# Patient Record
Sex: Male | Born: 1935 | Race: White | Hispanic: No | Marital: Married | Smoking: Never smoker
Health system: Southern US, Community
[De-identification: ages and names within clinical notes are randomized; demographics above are authoritative.]

## PROBLEM LIST (undated history)

## (undated) DIAGNOSIS — M1990 Unspecified osteoarthritis, unspecified site: Secondary | ICD-10-CM

## (undated) DIAGNOSIS — R112 Nausea with vomiting, unspecified: Secondary | ICD-10-CM

## (undated) DIAGNOSIS — S56419A Strain of extensor muscle, fascia and tendon of finger, unspecified finger at forearm level, initial encounter: Secondary | ICD-10-CM

## (undated) DIAGNOSIS — Z9889 Other specified postprocedural states: Secondary | ICD-10-CM

## (undated) DIAGNOSIS — S52501A Unspecified fracture of the lower end of right radius, initial encounter for closed fracture: Secondary | ICD-10-CM

## (undated) HISTORY — PX: PROSTATE SURGERY: SHX751

## (undated) HISTORY — PX: FOOT SURGERY: SHX648

## (undated) HISTORY — PX: CERVICAL LAMINECTOMY: SHX94

## (undated) HISTORY — PX: SHOULDER ARTHROSCOPY: SHX128

## (undated) HISTORY — PX: EYE SURGERY: SHX253

## (undated) HISTORY — PX: HERNIA REPAIR: SHX51

## (undated) HISTORY — PX: BACK SURGERY: SHX140

## (undated) HISTORY — PX: APPENDECTOMY: SHX54

---

## 2000-10-16 DIAGNOSIS — N209 Urinary calculus, unspecified: Secondary | ICD-10-CM | POA: Insufficient documentation

## 2000-10-16 DIAGNOSIS — N529 Male erectile dysfunction, unspecified: Secondary | ICD-10-CM | POA: Insufficient documentation

## 2002-08-28 DIAGNOSIS — E782 Mixed hyperlipidemia: Secondary | ICD-10-CM | POA: Insufficient documentation

## 2004-06-11 DIAGNOSIS — K279 Peptic ulcer, site unspecified, unspecified as acute or chronic, without hemorrhage or perforation: Secondary | ICD-10-CM | POA: Insufficient documentation

## 2007-07-26 DIAGNOSIS — M549 Dorsalgia, unspecified: Secondary | ICD-10-CM | POA: Insufficient documentation

## 2016-09-23 ENCOUNTER — Other Ambulatory Visit: Payer: Self-pay | Admitting: Orthopedic Surgery

## 2016-09-23 ENCOUNTER — Encounter (HOSPITAL_BASED_OUTPATIENT_CLINIC_OR_DEPARTMENT_OTHER): Payer: Self-pay | Admitting: *Deleted

## 2016-09-23 DIAGNOSIS — S52591A Other fractures of lower end of right radius, initial encounter for closed fracture: Secondary | ICD-10-CM | POA: Insufficient documentation

## 2016-09-23 DIAGNOSIS — M20011 Mallet finger of right finger(s): Secondary | ICD-10-CM | POA: Insufficient documentation

## 2016-09-29 ENCOUNTER — Ambulatory Visit (HOSPITAL_BASED_OUTPATIENT_CLINIC_OR_DEPARTMENT_OTHER): Payer: Medicare Other | Admitting: Anesthesiology

## 2016-09-29 ENCOUNTER — Ambulatory Visit (HOSPITAL_BASED_OUTPATIENT_CLINIC_OR_DEPARTMENT_OTHER)
Admission: RE | Admit: 2016-09-29 | Discharge: 2016-09-29 | Disposition: A | Discharge status: Home / Self Care | Payer: Medicare Other | Source: Ambulatory Visit | Attending: Orthopedic Surgery | Admitting: Orthopedic Surgery

## 2016-09-29 ENCOUNTER — Encounter (HOSPITAL_BASED_OUTPATIENT_CLINIC_OR_DEPARTMENT_OTHER)
Admission: RE | Disposition: A | Discharge status: Home / Self Care | Payer: Self-pay | Source: Ambulatory Visit | Attending: Orthopedic Surgery

## 2016-09-29 ENCOUNTER — Encounter (HOSPITAL_BASED_OUTPATIENT_CLINIC_OR_DEPARTMENT_OTHER): Payer: Self-pay | Admitting: *Deleted

## 2016-09-29 DIAGNOSIS — S52571A Other intraarticular fracture of lower end of right radius, initial encounter for closed fracture: Secondary | ICD-10-CM | POA: Diagnosis present

## 2016-09-29 DIAGNOSIS — Y92410 Unspecified street and highway as the place of occurrence of the external cause: Secondary | ICD-10-CM | POA: Insufficient documentation

## 2016-09-29 DIAGNOSIS — M20011 Mallet finger of right finger(s): Secondary | ICD-10-CM | POA: Diagnosis not present

## 2016-09-29 DIAGNOSIS — Z7982 Long term (current) use of aspirin: Secondary | ICD-10-CM | POA: Diagnosis not present

## 2016-09-29 HISTORY — DX: Unspecified fracture of the lower end of right radius, initial encounter for closed fracture: S52.501A

## 2016-09-29 HISTORY — DX: Unspecified osteoarthritis, unspecified site: M19.90

## 2016-09-29 HISTORY — PX: OPEN REDUCTION INTERNAL FIXATION (ORIF) DISTAL RADIAL FRACTURE: SHX5989

## 2016-09-29 SURGERY — OPEN REDUCTION INTERNAL FIXATION (ORIF) DISTAL RADIUS FRACTURE
Anesthesia: General | Site: Wrist | Laterality: Right

## 2016-09-29 MED ORDER — PROPOFOL 10 MG/ML IV BOLUS
INTRAVENOUS | Status: AC
  Filled 2016-09-29: qty 20

## 2016-09-29 MED ORDER — EPHEDRINE SULFATE 50 MG/ML IJ SOLN
INTRAMUSCULAR | Status: DC | PRN
  Administered 2016-09-29: 25 mg via INTRAVENOUS

## 2016-09-29 MED ORDER — FENTANYL CITRATE (PF) 100 MCG/2ML IJ SOLN
INTRAMUSCULAR | Status: AC
  Filled 2016-09-29: qty 2

## 2016-09-29 MED ORDER — ONDANSETRON HCL 4 MG/2ML IJ SOLN
INTRAMUSCULAR | Status: AC
  Filled 2016-09-29: qty 2

## 2016-09-29 MED ORDER — MIDAZOLAM HCL 2 MG/2ML IJ SOLN: 1.0000 mg | INTRAMUSCULAR | Status: DC | PRN

## 2016-09-29 MED ORDER — EPHEDRINE 5 MG/ML INJ
INTRAVENOUS | Status: AC
  Filled 2016-09-29: qty 10

## 2016-09-29 MED ORDER — BUPIVACAINE-EPINEPHRINE (PF) 0.5% -1:200000 IJ SOLN
INTRAMUSCULAR | Status: DC | PRN
  Administered 2016-09-29: 20 mL via PERINEURAL
  Administered 2016-09-29: 10 mL via PERINEURAL

## 2016-09-29 MED ORDER — ONDANSETRON HCL 4 MG/2ML IJ SOLN: 4.0000 mg | Freq: Once | INTRAMUSCULAR | Status: DC | PRN

## 2016-09-29 MED ORDER — FENTANYL CITRATE (PF) 100 MCG/2ML IJ SOLN
25.0000 ug | INTRAMUSCULAR | Status: DC | PRN
  Administered 2016-09-29: 50 ug via INTRAVENOUS
  Administered 2016-09-29: 25 ug via INTRAVENOUS

## 2016-09-29 MED ORDER — KETOROLAC TROMETHAMINE 30 MG/ML IJ SOLN
INTRAMUSCULAR | Status: AC
  Filled 2016-09-29: qty 1

## 2016-09-29 MED ORDER — ONDANSETRON HCL 4 MG/2ML IJ SOLN
INTRAMUSCULAR | Status: DC | PRN
  Administered 2016-09-29: 4 mg via INTRAVENOUS

## 2016-09-29 MED ORDER — SCOPOLAMINE 1 MG/3DAYS TD PT72: 1.0000 | MEDICATED_PATCH | Freq: Once | TRANSDERMAL | Status: DC | PRN

## 2016-09-29 MED ORDER — OXYCODONE-ACETAMINOPHEN 7.5-325 MG PO TABS: 1.0000 | ORAL_TABLET | ORAL | 0 refills | Status: DC | PRN

## 2016-09-29 MED ORDER — CHLORHEXIDINE GLUCONATE 4 % EX LIQD: 60.0000 mL | Freq: Once | CUTANEOUS | Status: DC

## 2016-09-29 MED ORDER — ACETAMINOPHEN 10 MG/ML IV SOLN
1000.0000 mg | Freq: Once | INTRAVENOUS | Status: AC
  Administered 2016-09-29: 1000 mg via INTRAVENOUS

## 2016-09-29 MED ORDER — ACETAMINOPHEN 10 MG/ML IV SOLN
INTRAVENOUS | Status: AC
  Filled 2016-09-29: qty 100

## 2016-09-29 MED ORDER — LACTATED RINGERS IV SOLN
INTRAVENOUS | Status: DC
  Administered 2016-09-29 (×2): via INTRAVENOUS

## 2016-09-29 MED ORDER — HYDROMORPHONE HCL 1 MG/ML IJ SOLN
INTRAMUSCULAR | Status: AC
  Filled 2016-09-29: qty 0.5

## 2016-09-29 MED ORDER — PREGABALIN 100 MG PO CAPS
100.0000 mg | ORAL_CAPSULE | Freq: Once | ORAL | Status: AC
  Administered 2016-09-29: 100 mg via ORAL
  Filled 2016-09-29: qty 1

## 2016-09-29 MED ORDER — OXYCODONE HCL 5 MG PO TABS
5.0000 mg | ORAL_TABLET | Freq: Once | ORAL | Status: AC
  Administered 2016-09-29: 5 mg via ORAL

## 2016-09-29 MED ORDER — MIDAZOLAM HCL 2 MG/2ML IJ SOLN
INTRAMUSCULAR | Status: AC
  Filled 2016-09-29: qty 2

## 2016-09-29 MED ORDER — PROPOFOL 10 MG/ML IV BOLUS
INTRAVENOUS | Status: DC | PRN
  Administered 2016-09-29: 150 mg via INTRAVENOUS
  Administered 2016-09-29: 50 mg via INTRAVENOUS

## 2016-09-29 MED ORDER — HYDROCODONE-ACETAMINOPHEN 5-325 MG PO TABS: 1.0000 | ORAL_TABLET | Freq: Four times a day (QID) | ORAL | 0 refills | Status: DC | PRN

## 2016-09-29 MED ORDER — DEXAMETHASONE SODIUM PHOSPHATE 4 MG/ML IJ SOLN
INTRAMUSCULAR | Status: DC | PRN
  Administered 2016-09-29: 10 mg via INTRAVENOUS

## 2016-09-29 MED ORDER — CEFAZOLIN SODIUM-DEXTROSE 2-4 GM/100ML-% IV SOLN
2.0000 g | INTRAVENOUS | Status: AC
  Administered 2016-09-29: 2 g via INTRAVENOUS

## 2016-09-29 MED ORDER — CEFAZOLIN SODIUM-DEXTROSE 2-4 GM/100ML-% IV SOLN
INTRAVENOUS | Status: AC
  Filled 2016-09-29: qty 100

## 2016-09-29 MED ORDER — ACETAMINOPHEN 500 MG PO TABS: 1000.0000 mg | ORAL_TABLET | Freq: Once | ORAL | Status: DC

## 2016-09-29 MED ORDER — LIDOCAINE HCL (CARDIAC) 20 MG/ML IV SOLN
INTRAVENOUS | Status: DC | PRN
  Administered 2016-09-29: 50 mg via INTRAVENOUS

## 2016-09-29 MED ORDER — FENTANYL CITRATE (PF) 100 MCG/2ML IJ SOLN
50.0000 ug | INTRAMUSCULAR | Status: DC | PRN
  Administered 2016-09-29: 50 ug via INTRAVENOUS

## 2016-09-29 MED ORDER — DEXAMETHASONE SODIUM PHOSPHATE 10 MG/ML IJ SOLN
INTRAMUSCULAR | Status: AC
  Filled 2016-09-29: qty 1

## 2016-09-29 MED ORDER — HYDROMORPHONE HCL 1 MG/ML IJ SOLN
0.5000 mg | Freq: Once | INTRAMUSCULAR | Status: AC
  Administered 2016-09-29: 0.5 mg via INTRAVENOUS

## 2016-09-29 MED ORDER — HYDROMORPHONE HCL 1 MG/ML IJ SOLN
0.5000 mg | Freq: Once | INTRAMUSCULAR | Status: AC
Start: 2016-09-29 — End: 2016-09-29
  Administered 2016-09-29: 0.5 mg via INTRAVENOUS

## 2016-09-29 MED ORDER — FENTANYL CITRATE (PF) 100 MCG/2ML IJ SOLN
INTRAMUSCULAR | Status: DC | PRN
  Administered 2016-09-29 (×8): 25 ug via INTRAVENOUS

## 2016-09-29 MED ORDER — PREGABALIN 50 MG PO CAPS: 50.0000 mg | ORAL_CAPSULE | Freq: Two times a day (BID) | ORAL | 0 refills | Status: DC

## 2016-09-29 MED ORDER — OXYCODONE HCL 5 MG PO TABS
ORAL_TABLET | ORAL | Status: AC
  Filled 2016-09-29: qty 1

## 2016-09-29 MED ORDER — LIDOCAINE 2% (20 MG/ML) 5 ML SYRINGE
INTRAMUSCULAR | Status: AC
  Filled 2016-09-29: qty 5

## 2016-09-29 MED ORDER — KETOROLAC TROMETHAMINE 30 MG/ML IJ SOLN
30.0000 mg | Freq: Once | INTRAMUSCULAR | Status: AC
  Administered 2016-09-29: 30 mg via INTRAVENOUS

## 2016-09-29 SURGICAL SUPPLY — 57 items
BIT DRILL 2.0 LNG QUCK RELEASE (BIT) ×1 IMPLANT
BIT DRILL 2.8 QUICK RELEASE (BIT) ×1 IMPLANT
BLADE MINI RND TIP GREEN BEAV (BLADE) ×3 IMPLANT
BLADE SURG 15 STRL LF DISP TIS (BLADE) ×1 IMPLANT
BLADE SURG 15 STRL SS (BLADE) ×2
BNDG COHESIVE 3X5 TAN STRL LF (GAUZE/BANDAGES/DRESSINGS) ×3 IMPLANT
BNDG ESMARK 4X9 LF (GAUZE/BANDAGES/DRESSINGS) ×3 IMPLANT
BNDG GAUZE ELAST 4 BULKY (GAUZE/BANDAGES/DRESSINGS) ×3 IMPLANT
BONE CHIP PRESERV 5CC PCAN5 (Bone Implant) ×3 IMPLANT
CHLORAPREP W/TINT 26ML (MISCELLANEOUS) ×3 IMPLANT
CORD BIPOLAR FORCEPS 12FT (ELECTRODE) ×3 IMPLANT
COVER BACK TABLE 60X90IN (DRAPES) ×3 IMPLANT
COVER MAYO STAND STRL (DRAPES) ×3 IMPLANT
CUFF TOURNIQUET SINGLE 18IN (TOURNIQUET CUFF) ×3 IMPLANT
DECANTER SPIKE VIAL GLASS SM (MISCELLANEOUS) IMPLANT
DRAPE EXTREMITY T 121X128X90 (DRAPE) ×3 IMPLANT
DRAPE OEC MINIVIEW 54X84 (DRAPES) ×3 IMPLANT
DRAPE SURG 17X23 STRL (DRAPES) ×3 IMPLANT
DRILL 2.0 LNG QUICK RELEASE (BIT) ×3
DRILL 2.8 QUICK RELEASE (BIT) ×3
GAUZE SPONGE 4X4 12PLY STRL (GAUZE/BANDAGES/DRESSINGS) ×3 IMPLANT
GAUZE XEROFORM 1X8 LF (GAUZE/BANDAGES/DRESSINGS) ×3 IMPLANT
GLOVE BIO SURGEON STRL SZ7 (GLOVE) ×3 IMPLANT
GLOVE BIOGEL PI IND STRL 7.0 (GLOVE) ×1 IMPLANT
GLOVE BIOGEL PI IND STRL 8.5 (GLOVE) ×1 IMPLANT
GLOVE BIOGEL PI INDICATOR 7.0 (GLOVE) ×2
GLOVE BIOGEL PI INDICATOR 8.5 (GLOVE) ×2
GLOVE SURG ORTHO 8.0 STRL STRW (GLOVE) ×3 IMPLANT
GOWN STRL REUS W/ TWL LRG LVL3 (GOWN DISPOSABLE) ×1 IMPLANT
GOWN STRL REUS W/ TWL XL LVL3 (GOWN DISPOSABLE) ×1 IMPLANT
GOWN STRL REUS W/TWL LRG LVL3 (GOWN DISPOSABLE) ×2
GOWN STRL REUS W/TWL XL LVL3 (GOWN DISPOSABLE) ×5 IMPLANT
GUIDEWIRE ORTHO 0.054X6 (WIRE) ×9 IMPLANT
NEEDLE PRECISIONGLIDE 27X1.5 (NEEDLE) IMPLANT
NS IRRIG 1000ML POUR BTL (IV SOLUTION) ×3 IMPLANT
PACK BASIN DAY SURGERY FS (CUSTOM PROCEDURE TRAY) ×3 IMPLANT
PAD CAST 3X4 CTTN HI CHSV (CAST SUPPLIES) ×1 IMPLANT
PADDING CAST COTTON 3X4 STRL (CAST SUPPLIES) ×2
PLATE PROXIMAL ACULOC 2 WD RT (Plate) ×3 IMPLANT
SCREW CORT FT 22X2.3XLCK HEX (Screw) ×2 IMPLANT
SCREW CORTICAL LOCKING 2.3X22M (Screw) ×10 IMPLANT
SCREW CORTICAL LOCKING 2.3X24M (Screw) ×4 IMPLANT
SCREW FX22X2.3XLCK SMTH NS CRT (Screw) ×3 IMPLANT
SCREW FX24X2.3XLCK SMTH NS CRT (Screw) ×2 IMPLANT
SCREW HEX 3.5X15 NLCKG STRL (Screw) ×1 IMPLANT
SCREW HEX 3.5X15MM (Screw) ×3 IMPLANT
SCREW HEXALOBE NON-LOCK 3.5X14 (Screw) ×9 IMPLANT
SLEEVE SCD COMPRESS KNEE MED (MISCELLANEOUS) ×3 IMPLANT
SPLINT PLASTER CAST XFAST 3X15 (CAST SUPPLIES) IMPLANT
SPLINT PLASTER XTRA FASTSET 3X (CAST SUPPLIES)
STOCKINETTE 4X48 STRL (DRAPES) ×3 IMPLANT
SUT ETHILON 4 0 PS 2 18 (SUTURE) ×3 IMPLANT
SUT VICRYL 4-0 PS2 18IN ABS (SUTURE) ×3 IMPLANT
SYR BULB 3OZ (MISCELLANEOUS) ×3 IMPLANT
SYR CONTROL 10ML LL (SYRINGE) IMPLANT
TOWEL OR 17X24 6PK STRL BLUE (TOWEL DISPOSABLE) ×3 IMPLANT
UNDERPAD 30X30 (UNDERPADS AND DIAPERS) ×3 IMPLANT

## 2016-09-29 NOTE — Op Note (Signed)
I assisted Surgeon(s) and Role:    * Daryll Brod, MD - Primary    Leanora Cover, MD - Assisting on the Procedure(s): OPEN REDUCTION INTERNAL FIXATION (ORIF) RIGHT DISTAL RADIAL FRACTURE on 09/29/2016.  I provided assistance on this case as follows: retraction soft tissues, reduction fracture, placement hardware.  Electronically signed by: Tennis Must, MD Date: 09/29/2016 Time: 4:09 PM

## 2016-09-29 NOTE — Anesthesia Procedure Notes (Signed)
Procedure Name: LMA Insertion Date/Time: 09/29/2016 2:49 PM Performed by: Toula Moos L Pre-anesthesia Checklist: Patient identified, Emergency Drugs available, Suction available, Patient being monitored and Timeout performed Patient Re-evaluated:Patient Re-evaluated prior to induction Oxygen Delivery Method: Circle system utilized Preoxygenation: Pre-oxygenation with 100% oxygen Induction Type: IV induction Ventilation: Mask ventilation without difficulty LMA: LMA inserted LMA Size: 5.0 Number of attempts: 1 Airway Equipment and Method: Bite block Placement Confirmation: positive ETCO2 Tube secured with: Tape Dental Injury: Teeth and Oropharynx as per pre-operative assessment

## 2016-09-29 NOTE — Brief Op Note (Signed)
09/29/2016  4:08 PM  PATIENT:  Rodney Hill.  82 y.o. male  PRE-OPERATIVE DIAGNOSIS:  RIGHT DISTAL RADIUS FRACTURE  POST-OPERATIVE DIAGNOSIS:  RIGHT DISTAL RADIUS FRACTURE  PROCEDURE:  Procedure(s): OPEN REDUCTION INTERNAL FIXATION (ORIF) RIGHT DISTAL RADIAL FRACTURE (Right)  SURGEON:  Surgeon(s) and Role:    * Daryll Brod, MD - Primary    * Leanora Cover, MD - Assisting  PHYSICIAN ASSISTANT:   ASSISTANTS: K Gaddiel Cullens,MD   ANESTHESIA:   regional and general  EBL:  Total I/O In: 1600 [I.V.:1600] Out: 2 [Blood:2]  BLOOD ADMINISTERED:none  DRAINS: none   LOCAL MEDICATIONS USED:  NONE  SPECIMEN:  No Specimen  DISPOSITION OF SPECIMEN:  N/A  COUNTS:  YES  TOURNIQUET:   Total Tourniquet Time Documented: Upper Arm (laterality) - 60 minutes Total: Upper Arm (laterality) - 60 minutes   DICTATION: .Other Dictation: Dictation Number [id number]  PLAN OF CARE: Discharge to home after PACU  PATIENT DISPOSITION:  PACU - hemodynamically stable.

## 2016-09-29 NOTE — Progress Notes (Signed)
Assisted Dr. Turk with right, ultrasound guided, supraclavicular block. Side rails up, monitors on throughout procedure. See vital signs in flow sheet. Tolerated Procedure well. 

## 2016-09-29 NOTE — Discharge Instructions (Signed)

## 2016-09-29 NOTE — Anesthesia Procedure Notes (Signed)
Anesthesia Regional Block: Supraclavicular block   Pre-Anesthetic Checklist: ,, timeout performed, Correct Patient, Correct Site, Correct Laterality, Correct Procedure, Correct Position, site marked, Risks and benefits discussed,  Surgical consent,  Pre-op evaluation,  At surgeon's request and post-op pain management  Laterality: Right  Prep: chloraprep       Needles:  Injection technique: Single-shot  Needle Type: Echogenic Needle     Needle Length: 9cm  Needle Gauge: 21     Additional Needles:   Procedures: ultrasound guided,,,,,,,,  Narrative:  Start time: 09/29/2016 1:51 PM End time: 09/29/2016 1:56 PM Injection made incrementally with aspirations every 5 mL.  Performed by: Personally  Anesthesiologist: Catalina Gravel  Additional Notes: No pain on injection. No increased resistance to injection. Injection made in 5cc increments.  Good needle visualization.  Patient tolerated procedure well.

## 2016-09-29 NOTE — Progress Notes (Signed)
Shortly after arrival to PACU at 1615, patient awoke to extreme pain from his wrist going down through his fingers.  Capillary refill is present, color is appropriate, able to purposefully move fingers and arm, bandage does not appear tight per RN, fingers have swelling but remain soft. Daryll Brod, MD assessed along with Hoy Morn, Anesthesiologist.   [phone], follow up call by RN to Daryll Brod, MD.  MD aware of patient condition, pain is beginning to lessen, however still very uncomfortable.  RN will continue to monitor appropriately.

## 2016-09-29 NOTE — Op Note (Signed)
:   Dictation Number [id number]

## 2016-09-29 NOTE — H&P (Signed)
Rodney Hill. is an 82 y.o. male.   Chief Complaint:fracture right wrist HPI: Rodney Hill is a 82 year old right-hand-dominant male referred by Dr. Vanita Panda for consultation regarding a distal radius fracture right arm. He sustained a fall approximately 09/14/2016 he had a fracture of his heel was placed in a boot which resulted in his inability to meet remain upright. He is in addition sustained a mallet injury to his right ring finger. He has no prior history of injury. He has had a carpal tunnel release done in the past. He has been placed in a splint and referred. He complains of constant throbbing aching pain with a VAS score of 6-7/10. He has no history of diabetes thyroid problems or gout. He has a history of arthritis. History is positive arthritis negative for diabetes thyroid problems and gout. Is given a prescription for nitro hydrocodone which he has not taken. He has been taking Tylenol for pain. Patient seems to make his discomfort better. I hang it down makes it worse.      Past Medical History:  Diagnosis Date  . Arthritis   . Distal radius fracture, right     Past Surgical History:  Procedure Laterality Date  . APPENDECTOMY    . BACK SURGERY    . CERVICAL LAMINECTOMY    . EYE SURGERY Bilateral    cataract  . FOOT SURGERY Right    x2  . HERNIA REPAIR     UHR  . PROSTATE SURGERY    . SHOULDER ARTHROSCOPY Bilateral     History reviewed. No pertinent family history. Social History:  reports that he has never smoked. He has never used smokeless tobacco. He reports that he does not drink alcohol or use drugs.  Allergies: No Known Allergies  Medications Prior to Admission  Medication Sig Dispense Refill  . aspirin EC 81 MG tablet Take 81 mg by mouth daily.    Marland Kitchen HYDROcodone-acetaminophen (NORCO/VICODIN) 5-325 MG tablet Take 1 tablet by mouth every 6 (six) hours as needed for moderate pain.    . traMADol (ULTRAM) 50 MG tablet Take by mouth every 6 (six) hours as needed.       No results found for this or any previous visit (from the past 48 hour(s)).  No results found.   Pertinent items are noted in HPI.  Height 6\' 1"  (1.854 m), weight 96.2 kg (212 lb).  General appearance: alert, cooperative and appears stated age Head: Normocephalic, without obvious abnormality Neck: no JVD Resp: clear to auscultation bilaterally Cardio: regular rate and rhythm, S1, S2 normal, no murmur, click, rub or gallop GI: soft, non-tender; bowel sounds normal; no masses,  no organomegaly Extremities: fracture right wrist Pulses: 2+ and symmetric Skin: Skin color, texture, turgor normal. No rashes or lesions Neurologic: Grossly normal Incision/Wound: na  Assessment/Plan Assessment:  1. Other fractures of lower end of right radius 2. Acquired mallet finger of right hand    Plan: Discussed open reduction internal fixation bone grafting with him. He is aware there is no guarantee to the surgery the possibility of infection recurrence injury to arteries nerves tendons incomplete relief symptoms dystrophy. Possibility of having to remove plate later. Use of bone graft. He Would like to proceed. we will place him in a Hexyl light splint on his ring finger postoperatively. Questions are encouraged and answered to his satisfaction.      Kilani Joffe R 09/29/2016, 12:58 PM

## 2016-09-29 NOTE — Anesthesia Preprocedure Evaluation (Signed)
Anesthesia Evaluation  Patient identified by MRN, date of birth, ID band Patient awake    Reviewed: Allergy & Precautions, NPO status , Patient's Chart, lab work & pertinent test results  Airway Mallampati: II  TM Distance: >3 FB Neck ROM: Full    Dental  (+) Teeth Intact, Dental Advisory Given   Pulmonary neg pulmonary ROS,    Pulmonary exam normal breath sounds clear to auscultation       Cardiovascular Exercise Tolerance: Good negative cardio ROS Normal cardiovascular exam Rhythm:Regular Rate:Normal     Neuro/Psych S/p cervical laminectomy  negative neurological ROS     GI/Hepatic negative GI ROS, Neg liver ROS,   Endo/Other  negative endocrine ROS  Renal/GU negative Renal ROS     Musculoskeletal  (+) Arthritis , Osteoarthritis,  Right distal radius fracture    Abdominal   Peds  Hematology negative hematology ROS (+)   Anesthesia Other Findings Day of surgery medications reviewed with the patient.  Reproductive/Obstetrics                             Anesthesia Physical Anesthesia Plan  ASA: II  Anesthesia Plan: General   Post-op Pain Management:  Regional for Post-op pain   Induction: Intravenous  PONV Risk Score and Plan: 2 and Ondansetron and Dexamethasone  Airway Management Planned: LMA  Additional Equipment:   Intra-op Plan:   Post-operative Plan: Extubation in OR  Informed Consent: I have reviewed the patients History and Physical, chart, labs and discussed the procedure including the risks, benefits and alternatives for the proposed anesthesia with the patient or authorized representative who has indicated his/her understanding and acceptance.   Dental advisory given  Plan Discussed with: CRNA  Anesthesia Plan Comments: (Risks/benefits of general anesthesia discussed with patient including risk of damage to teeth, lips, gum, and tongue, nausea/vomiting,  allergic reactions to medications, and the possibility of heart attack, stroke and death.  All patient questions answered.  Patient wishes to proceed.)        Anesthesia Quick Evaluation

## 2016-09-29 NOTE — Transfer of Care (Signed)
Immediate Anesthesia Transfer of Care Note  Patient: Rodney Hill.  Procedure(s) Performed: Procedure(s): OPEN REDUCTION INTERNAL FIXATION (ORIF) RIGHT DISTAL RADIAL FRACTURE (Right)  Patient Location: PACU  Anesthesia Type:GA combined with regional for post-op pain  Level of Consciousness: sedated  Airway & Oxygen Therapy: Patient Spontanous Breathing and Patient connected to face mask oxygen  Post-op Assessment: Report given to RN and Post -op Vital signs reviewed and stable  Post vital signs: Reviewed and stable  Last Vitals:  Vitals:   09/29/16 1410 09/29/16 1415  BP:    Pulse: 69 75  Resp: 19 18  Temp:      Last Pain:  Vitals:   09/29/16 1331  TempSrc: Oral  PainSc: 3       Patients Stated Pain Goal: 3 (47/09/29 5747)  Complications: No apparent anesthesia complications

## 2016-09-30 ENCOUNTER — Encounter (HOSPITAL_BASED_OUTPATIENT_CLINIC_OR_DEPARTMENT_OTHER): Payer: Self-pay | Admitting: Orthopedic Surgery

## 2016-09-30 NOTE — Anesthesia Postprocedure Evaluation (Signed)
Anesthesia Post Note  Patient: Rodney Hill.  Procedure(s) Performed: Procedure(s) (LRB): OPEN REDUCTION INTERNAL FIXATION (ORIF) RIGHT DISTAL RADIAL FRACTURE (Right)     Patient location during evaluation: PACU Anesthesia Type: General Level of consciousness: awake and alert Pain management: pain level controlled Vital Signs Assessment: post-procedure vital signs reviewed and stable Respiratory status: spontaneous breathing, nonlabored ventilation, respiratory function stable and patient connected to nasal cannula oxygen Cardiovascular status: blood pressure returned to baseline and stable Postop Assessment: no signs of nausea or vomiting Anesthetic complications: no    Last Vitals:  Vitals:   09/29/16 1800 09/29/16 1820  BP: (!) 173/79 (!) 152/78  Pulse:  82  Resp: 15 18  Temp:  36.9 C    Last Pain:  Vitals:   09/30/16 0840  TempSrc:   PainSc: 3    Pain Goal: Patients Stated Pain Goal: 3 (09/23/16 1544)               Catalina Gravel
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
## 2016-10-03 ENCOUNTER — Encounter (HOSPITAL_BASED_OUTPATIENT_CLINIC_OR_DEPARTMENT_OTHER): Payer: Self-pay | Admitting: Orthopedic Surgery

## 2016-11-09 DIAGNOSIS — M1811 Unilateral primary osteoarthritis of first carpometacarpal joint, right hand: Secondary | ICD-10-CM | POA: Insufficient documentation

## 2016-12-02 DIAGNOSIS — M65311 Trigger thumb, right thumb: Secondary | ICD-10-CM | POA: Insufficient documentation

## 2017-01-04 DIAGNOSIS — R200 Anesthesia of skin: Secondary | ICD-10-CM | POA: Insufficient documentation

## 2017-01-13 ENCOUNTER — Other Ambulatory Visit: Payer: Self-pay | Admitting: Orthopedic Surgery

## 2017-02-17 ENCOUNTER — Encounter (HOSPITAL_BASED_OUTPATIENT_CLINIC_OR_DEPARTMENT_OTHER): Payer: Self-pay | Admitting: *Deleted

## 2017-02-21 ENCOUNTER — Ambulatory Visit (HOSPITAL_BASED_OUTPATIENT_CLINIC_OR_DEPARTMENT_OTHER)
Admission: RE | Admit: 2017-02-21 | Discharge: 2017-02-21 | Disposition: A | Discharge status: Home / Self Care | Payer: Medicare Other | Source: Ambulatory Visit | Attending: Orthopedic Surgery | Admitting: Orthopedic Surgery

## 2017-02-21 ENCOUNTER — Encounter (HOSPITAL_BASED_OUTPATIENT_CLINIC_OR_DEPARTMENT_OTHER): Payer: Self-pay | Admitting: *Deleted

## 2017-02-21 ENCOUNTER — Encounter (HOSPITAL_BASED_OUTPATIENT_CLINIC_OR_DEPARTMENT_OTHER)
Admission: RE | Disposition: A | Discharge status: Home / Self Care | Payer: Self-pay | Source: Ambulatory Visit | Attending: Orthopedic Surgery

## 2017-02-21 ENCOUNTER — Other Ambulatory Visit: Payer: Self-pay

## 2017-02-21 ENCOUNTER — Ambulatory Visit (HOSPITAL_BASED_OUTPATIENT_CLINIC_OR_DEPARTMENT_OTHER): Payer: Medicare Other | Admitting: Anesthesiology

## 2017-02-21 DIAGNOSIS — M1811 Unilateral primary osteoarthritis of first carpometacarpal joint, right hand: Secondary | ICD-10-CM | POA: Insufficient documentation

## 2017-02-21 DIAGNOSIS — M65351 Trigger finger, right little finger: Secondary | ICD-10-CM | POA: Insufficient documentation

## 2017-02-21 DIAGNOSIS — Z7982 Long term (current) use of aspirin: Secondary | ICD-10-CM | POA: Diagnosis not present

## 2017-02-21 DIAGNOSIS — Z79899 Other long term (current) drug therapy: Secondary | ICD-10-CM | POA: Diagnosis not present

## 2017-02-21 DIAGNOSIS — G5601 Carpal tunnel syndrome, right upper limb: Secondary | ICD-10-CM | POA: Diagnosis not present

## 2017-02-21 DIAGNOSIS — Z886 Allergy status to analgesic agent status: Secondary | ICD-10-CM | POA: Insufficient documentation

## 2017-02-21 HISTORY — PX: CARPAL TUNNEL RELEASE: SHX101

## 2017-02-21 HISTORY — PX: TRIGGER FINGER RELEASE: SHX641

## 2017-02-21 SURGERY — CARPAL TUNNEL RELEASE
Anesthesia: Monitor Anesthesia Care | Laterality: Right

## 2017-02-21 MED ORDER — CEFAZOLIN SODIUM-DEXTROSE 2-4 GM/100ML-% IV SOLN
INTRAVENOUS | Status: AC
  Filled 2017-02-21: qty 100

## 2017-02-21 MED ORDER — HYDROCODONE-ACETAMINOPHEN 5-325 MG PO TABS
1.0000 | ORAL_TABLET | Freq: Four times a day (QID) | ORAL | 0 refills | Status: DC | PRN
Start: 2017-02-21 — End: 2018-02-19

## 2017-02-21 MED ORDER — ONDANSETRON HCL 4 MG/2ML IJ SOLN
INTRAMUSCULAR | Status: DC | PRN
  Administered 2017-02-21: 4 mg via INTRAVENOUS

## 2017-02-21 MED ORDER — PROMETHAZINE HCL 25 MG/ML IJ SOLN: 6.2500 mg | INTRAMUSCULAR | Status: DC | PRN

## 2017-02-21 MED ORDER — PROPOFOL 500 MG/50ML IV EMUL
INTRAVENOUS | Status: DC | PRN
  Administered 2017-02-21: 75 ug/kg/min via INTRAVENOUS

## 2017-02-21 MED ORDER — ONDANSETRON HCL 4 MG/2ML IJ SOLN
INTRAMUSCULAR | Status: AC
  Filled 2017-02-21: qty 2

## 2017-02-21 MED ORDER — SCOPOLAMINE 1 MG/3DAYS TD PT72: 1.0000 | MEDICATED_PATCH | Freq: Once | TRANSDERMAL | Status: DC | PRN

## 2017-02-21 MED ORDER — LIDOCAINE HCL (PF) 0.5 % IJ SOLN
INTRAMUSCULAR | Status: DC | PRN
  Administered 2017-02-21: 35 mL via INTRAVENOUS

## 2017-02-21 MED ORDER — LACTATED RINGERS IV SOLN
INTRAVENOUS | Status: DC
  Administered 2017-02-21: 10:00:00 via INTRAVENOUS

## 2017-02-21 MED ORDER — FENTANYL CITRATE (PF) 100 MCG/2ML IJ SOLN
INTRAMUSCULAR | Status: AC
  Filled 2017-02-21: qty 2

## 2017-02-21 MED ORDER — FENTANYL CITRATE (PF) 100 MCG/2ML IJ SOLN
50.0000 ug | INTRAMUSCULAR | Status: DC | PRN
  Administered 2017-02-21: 50 ug via INTRAVENOUS

## 2017-02-21 MED ORDER — CHLORHEXIDINE GLUCONATE 4 % EX LIQD: 60.0000 mL | Freq: Once | CUTANEOUS | Status: DC

## 2017-02-21 MED ORDER — FENTANYL CITRATE (PF) 100 MCG/2ML IJ SOLN: 25.0000 ug | INTRAMUSCULAR | Status: DC | PRN

## 2017-02-21 MED ORDER — BUPIVACAINE HCL (PF) 0.25 % IJ SOLN
INTRAMUSCULAR | Status: DC | PRN
  Administered 2017-02-21: 9 mL

## 2017-02-21 MED ORDER — MIDAZOLAM HCL 2 MG/2ML IJ SOLN: 1.0000 mg | INTRAMUSCULAR | Status: DC | PRN

## 2017-02-21 MED ORDER — CEFAZOLIN SODIUM-DEXTROSE 2-4 GM/100ML-% IV SOLN
2.0000 g | INTRAVENOUS | Status: AC
  Administered 2017-02-21: 2 g via INTRAVENOUS

## 2017-02-21 SURGICAL SUPPLY — 31 items
BLADE SURG 15 STRL LF DISP TIS (BLADE) ×2 IMPLANT
BLADE SURG 15 STRL SS (BLADE) ×2
BNDG COHESIVE 3X5 TAN STRL LF (GAUZE/BANDAGES/DRESSINGS) ×4 IMPLANT
BNDG ESMARK 4X9 LF (GAUZE/BANDAGES/DRESSINGS) ×4 IMPLANT
BNDG GAUZE ELAST 4 BULKY (GAUZE/BANDAGES/DRESSINGS) ×4 IMPLANT
CHLORAPREP W/TINT 26ML (MISCELLANEOUS) ×4 IMPLANT
CORD BIPOLAR FORCEPS 12FT (ELECTRODE) ×4 IMPLANT
COVER BACK TABLE 60X90IN (DRAPES) ×4 IMPLANT
COVER MAYO STAND STRL (DRAPES) ×4 IMPLANT
CUFF TOURNIQUET SINGLE 18IN (TOURNIQUET CUFF) ×4 IMPLANT
DRAPE EXTREMITY T 121X128X90 (DRAPE) ×4 IMPLANT
DRAPE SURG 17X23 STRL (DRAPES) ×4 IMPLANT
DRSG PAD ABDOMINAL 8X10 ST (GAUZE/BANDAGES/DRESSINGS) ×4 IMPLANT
GAUZE SPONGE 4X4 12PLY STRL (GAUZE/BANDAGES/DRESSINGS) ×4 IMPLANT
GAUZE XEROFORM 1X8 LF (GAUZE/BANDAGES/DRESSINGS) ×4 IMPLANT
GLOVE BIOGEL PI IND STRL 8.5 (GLOVE) ×2 IMPLANT
GLOVE BIOGEL PI INDICATOR 8.5 (GLOVE) ×2
GLOVE SURG ORTHO 8.0 STRL STRW (GLOVE) ×4 IMPLANT
GOWN STRL REUS W/ TWL LRG LVL3 (GOWN DISPOSABLE) ×2 IMPLANT
GOWN STRL REUS W/TWL LRG LVL3 (GOWN DISPOSABLE) ×2
GOWN STRL REUS W/TWL XL LVL3 (GOWN DISPOSABLE) ×4 IMPLANT
NEEDLE PRECISIONGLIDE 27X1.5 (NEEDLE) ×4 IMPLANT
NS IRRIG 1000ML POUR BTL (IV SOLUTION) ×4 IMPLANT
PACK BASIN DAY SURGERY FS (CUSTOM PROCEDURE TRAY) ×4 IMPLANT
STOCKINETTE 4X48 STRL (DRAPES) ×4 IMPLANT
SUT ETHILON 4 0 PS 2 18 (SUTURE) ×4 IMPLANT
SUT VICRYL 4-0 PS2 18IN ABS (SUTURE) IMPLANT
SYR BULB 3OZ (MISCELLANEOUS) ×4 IMPLANT
SYR CONTROL 10ML LL (SYRINGE) ×4 IMPLANT
TOWEL OR 17X24 6PK STRL BLUE (TOWEL DISPOSABLE) ×4 IMPLANT
UNDERPAD 30X30 (UNDERPADS AND DIAPERS) ×4 IMPLANT

## 2017-02-21 NOTE — Anesthesia Preprocedure Evaluation (Signed)
Anesthesia Evaluation  Patient identified by MRN, date of birth, ID band Patient awake    Reviewed: Allergy & Precautions, NPO status , Patient's Chart, lab work & pertinent test results  Airway Mallampati: II  TM Distance: >3 FB Neck ROM: Full    Dental no notable dental hx.    Pulmonary neg pulmonary ROS,    Pulmonary exam normal breath sounds clear to auscultation       Cardiovascular negative cardio ROS Normal cardiovascular exam Rhythm:Regular Rate:Normal     Neuro/Psych negative neurological ROS  negative psych ROS   GI/Hepatic negative GI ROS, Neg liver ROS,   Endo/Other  negative endocrine ROS  Renal/GU negative Renal ROS  negative genitourinary   Musculoskeletal negative musculoskeletal ROS (+) Arthritis ,   Abdominal   Peds negative pediatric ROS (+)  Hematology negative hematology ROS (+)   Anesthesia Other Findings   Reproductive/Obstetrics negative OB ROS                             Anesthesia Physical Anesthesia Plan  ASA: I  Anesthesia Plan: MAC and Bier Block and Bier Block-LIDOCAINE ONLY   Post-op Pain Management:    Induction: Intravenous  PONV Risk Score and Plan: 0  Airway Management Planned: Simple Face Mask  Additional Equipment:   Intra-op Plan:   Post-operative Plan:   Informed Consent: I have reviewed the patients History and Physical, chart, labs and discussed the procedure including the risks, benefits and alternatives for the proposed anesthesia with the patient or authorized representative who has indicated his/her understanding and acceptance.   Dental advisory given  Plan Discussed with: CRNA and Surgeon  Anesthesia Plan Comments:         Anesthesia Quick Evaluation

## 2017-02-21 NOTE — Discharge Instructions (Addendum)

## 2017-02-21 NOTE — Transfer of Care (Signed)
Immediate Anesthesia Transfer of Care Note  Patient: Rodney Hill.  Procedure(s) Performed: RIGHT CARPAL TUNNEL RELEASE (Right ) RELEASE TRIGGER FINGER/A-1 PULLEY RIGHT SMALL FINGER  Patient Location: PACU  Anesthesia Type:Bier block  Level of Consciousness: awake, alert  and oriented  Airway & Oxygen Therapy: Patient Spontanous Breathing and Patient connected to face mask oxygen  Post-op Assessment: Report given to RN and Post -op Vital signs reviewed and stable  Post vital signs: Reviewed and stable  Last Vitals:  Vitals:   02/21/17 0943  BP: (!) 146/64  Pulse: (!) 57  Resp: 18  Temp: 36.5 C  SpO2: 98%    Last Pain:  Vitals:   02/21/17 0943  TempSrc: Oral      Patients Stated Pain Goal: 0 (28/41/32 4401)  Complications: No apparent anesthesia complications

## 2017-02-21 NOTE — H&P (Signed)
Rodney Hill. is an 82 y.o. male.   Chief Complaint: numbness right handHPI: Mr. Rodney Hill is an 82 year old right-hand-dominant male who has not been seen since 01/04/2017. He is complaining numbness and tingling in the median his median nerve distribution. He is status post distal radius fracture open reduction internal fixation. He is complaining of pain in his thumb ray At the basilar joint. He has arthritis at that joint. He is tried his wife's topical nonsteroidal anti-inflammatory which has helped. He was sent for nerve conductions with Dr. Thereasa Hill. These have been done. Reveals a carpal tunnel syndrome on his right side. Enlargement of the nerve. He has had carpal tunnel release done 10-15 years ago. He has no radiation of his discomfort. Been undergoing therapy for his distal radius fracture. He is complaining of dropping things area He has developed catching of his right small finger and would like to have it released.          Past Medical History:  Diagnosis Date  . Arthritis   . Distal radius fracture, right     Past Surgical History:  Procedure Laterality Date  . APPENDECTOMY    . BACK SURGERY    . CERVICAL LAMINECTOMY    . EYE SURGERY Bilateral    cataract  . FOOT SURGERY Right    x2  . HERNIA REPAIR     UHR  . OPEN REDUCTION INTERNAL FIXATION (ORIF) DISTAL RADIAL FRACTURE Right 09/29/2016   Procedure: OPEN REDUCTION INTERNAL FIXATION (ORIF) RIGHT DISTAL RADIAL FRACTURE;  Surgeon: Rodney Brod, MD;  Location: [place];  Service: Orthopedics;  Laterality: Right;  . PROSTATE SURGERY    . SHOULDER ARTHROSCOPY Bilateral     History reviewed. No pertinent family history. Social History:  reports that  has never smoked. he has never used smokeless tobacco. He reports that he does not drink alcohol or use drugs.  Allergies:  Allergies  Allergen Reactions  . Ibuprofen     (avoids due to ulcer history)    No medications prior to admission.    No  results found for this or any previous visit (from the past 48 hour(s)).  No results found.   Pertinent items are noted in HPI.  Height 6\' 1"  (1.854 m), weight 96.2 kg (212 lb).  General appearance: alert, cooperative and appears stated age Head: Normocephalic, without obvious abnormality Neck: no JVD Resp: clear to auscultation bilaterally Cardio: regular rate and rhythm, S1, S2 normal, no murmur, click, rub or gallop GI: soft, non-tender; bowel sounds normal; no masses,  no organomegaly Extremities: numbness right hand and catching right small finger Pulses: 2+ and symmetric Skin: Skin color, texture, turgor normal. No rashes or lesions Neurologic: Grossly normal Incision/Wound: na  Assessment/Plan Assessment:  Right carpal tunnel syndrome Trigger right small finger  Plan: We have discussed release of his carpal tunnel with him in an effort to improve his sensibility. We would not recommend doing anything to his arthritis at the present time. He would like to proceed to have this done. He is scheduled for right carpal tunnel release and trigger release right small finger as an outpatient under regional anesthesia. He is where there is no guarantee to the surgery the possibility of infection recurrence injury to arteries nerves tendons complete relief symptoms dystrophy.      Rodney Hill R 02/21/2017, 6:03 AM

## 2017-02-21 NOTE — Op Note (Signed)
Dictation Number [id number]

## 2017-02-21 NOTE — Anesthesia Postprocedure Evaluation (Signed)
Anesthesia Post Note  Patient: Nivin Nyxon Strupp.  Procedure(s) Performed: RIGHT CARPAL TUNNEL RELEASE (Right ) RELEASE TRIGGER FINGER/A-1 PULLEY RIGHT SMALL FINGER     Patient location during evaluation: PACU Anesthesia Type: MAC and Bier Block Level of consciousness: awake and alert Pain management: pain level controlled Vital Signs Assessment: post-procedure vital signs reviewed and stable Respiratory status: spontaneous breathing, nonlabored ventilation, respiratory function stable and patient connected to nasal cannula oxygen Cardiovascular status: stable and blood pressure returned to baseline Postop Assessment: no apparent nausea or vomiting Anesthetic complications: no    Last Vitals:  Vitals:   02/21/17 1115 02/21/17 1130  BP: (!) 151/82   Pulse: (!) 109 66  Resp: 16 (!) 21  Temp:    SpO2: 98% 98%    Last Pain:  Vitals:   02/21/17 1129  TempSrc:   PainSc: 4                  Lovie Agresta S

## 2017-02-21 NOTE — Brief Op Note (Signed)
02/21/2017  10:58 AM  PATIENT:  Rodney Hill.  82 y.o. male  PRE-OPERATIVE DIAGNOSIS:  RECURRENT RIGHT CARPAL TUNNEL SYNDROME  POST-OPERATIVE DIAGNOSIS:  RECURRENT RIGHT CARPAL TUNNEL SYNDROME, RIGHT SMALL FINGER TRIGGER FINGER  PROCEDURE:  Procedure(s) with comments: RIGHT CARPAL TUNNEL RELEASE (Right) - UPPERARM RELEASE TRIGGER FINGER/A-1 PULLEY RIGHT SMALL FINGER  SURGEON:  Surgeon(s) and Role:    * Daryll Brod, MD - Primary  PHYSICIAN ASSISTANT:   ASSISTANTS: none   ANESTHESIA:   local, regional and IV sedation  EBL:  2 mL   BLOOD ADMINISTERED:none  DRAINS: none   LOCAL MEDICATIONS USED:  BUPIVICAINE   SPECIMEN:  No Specimen  DISPOSITION OF SPECIMEN:  N/A  COUNTS:  YES  TOURNIQUET:   Total Tourniquet Time Documented: Upper Arm (Right) - 28 minutes Total: Upper Arm (Right) - 28 minutes   DICTATION: .Other Dictation: Dictation Number [id number]  PLAN OF CARE: Discharge to home after PACU  PATIENT DISPOSITION:  PACU - hemodynamically stable.

## 2017-02-22 ENCOUNTER — Encounter (HOSPITAL_BASED_OUTPATIENT_CLINIC_OR_DEPARTMENT_OTHER): Payer: Self-pay | Admitting: Orthopedic Surgery

## 2017-02-22 NOTE — Op Note (Signed)
Rodney Hill, Rodney Hill                 ACCOUNT NO.:  [id number]  MEDICAL RECORD NO.:  [id number]  LOCATION:                                 FACILITY:  PHYSICIAN:  Daryll Brod, M.D.            DATE OF BIRTH:  DATE OF PROCEDURE:  02/13/2017 DATE OF DISCHARGE:                              OPERATIVE REPORT   PREOPERATIVE DIAGNOSIS:  Carpal tunnel syndrome along with triggering of right small finger.  POSTOPERATIVE DIAGNOSIS:  Carpal tunnel syndrome along with triggering of right small finger.  OPERATION:  Release of right carpal canal.  Release of A1 pulley, right small finger.  SURGEON:  Daryll Brod, MD.  ASSISTANT:  None.  ANESTHESIA:  Forearm IV regional with local infiltration and IV sedation.  PLACE OF SURGERY:  [facility].  ANESTHESIOLOGIST:  Rose.  HISTORY:  The patient is an 82 year old male who is complaining of numbness and tingling, post open reduction and internal fixation of a distal radius fracture, which has healed.  Nerve conductions reveal carpal tunnel syndrome being present.  He also is now complaining of catching of the small finger of right hand.  He is desirous proceeding to have the each of these problems surgically taken care of.  He is aware that there is no guarantee to the surgery, the possibility of infection, recurrence of injury to arteries, nerves, and tendons, incomplete relief of symptoms, and dystrophy.  In the preoperative area, the patient is seen, the extremity marked by both patient and surgeon, antibiotic given.  DESCRIPTION OF PROCEDURE:  The patient was brought to the operating room where a forearm-based IV regional anesthetic was carried out without difficulty under the direction of the Anesthesia Department.  He was prepped using ChloraPrep in supine position with the right arm free.  A 3-minute dry time was allowed and time-out taken confirming the patient and procedure after adequate anesthesia was afforded.  An  oblique incision was made over the A1 pulley of the right small finger.  This was carried down through subcutaneous tissue.  Retractors were placed retracting the radial and ulnar digital arteries.  The A1 pulley of the flexor sheath was identified.  This was released on its radial aspect. A small incision was made centrally in A2.  The tenosynovial tissue proximally was separated with blunt dissection.  The 2 tendons were then separated to break any adhesions between the 2 flexor tendons.  The finger was placed through a full passive range of motion.  No further triggering was noted.  The wound was irrigated and closed with interrupted 4-0 nylon sutures.  A separate incision was then made longitudinally in the right palm.  This was carried down through subcutaneous tissue.  Bleeders were electrocauterized with bipolar.  The palmar fascia was split.  The superficial palmar arch was identified. The flexor tendon to the ring and little finger was identified. Retractors were placed retracting the median nerve of the flexor tendons radially and the ulnar nerve ulnarly.  An incision was then made to the flexor retinaculum on its ulnar border.  A right angle and Sewell retractor were placed between skin and forearm  fascia and this was released proximally for approximately 2 cm.  This was done after dissecting any deep structures away from the forearm fascia with blunt scissors.  Blunt scissors were used to transect this proximally.  The canal was explored.  The nerve was identified and found to be very hyperemic over a course.  The motor branch entered into muscle distally. The wound was copiously irrigated with saline.  The skin was then closed with interrupted 4-0 nylon sutures.  A local infiltration with 0.25% bupivacaine without epinephrine was given.  Approximately 8 total mL was used for the 2 incisions.  A sterile compressive dressing with the fingers free was applied.  On deflation of  the tourniquet, all fingers immediately pinked.  He was taken to the recovery room for observation in satisfactory condition.  He will be discharged to home to return to [place] in 1 week, on Nor[place].          ______________________________ Daryll Brod, M.D.     GK/MEDQ  D:  02/21/2017  T:  02/22/2017  Job:  [id number]

## 2017-03-29 DIAGNOSIS — K611 Rectal abscess: Secondary | ICD-10-CM | POA: Insufficient documentation

## 2017-06-05 ENCOUNTER — Ambulatory Visit (INDEPENDENT_AMBULATORY_CARE_PROVIDER_SITE_OTHER): Payer: Medicare Other | Admitting: Orthopaedic Surgery

## 2017-06-05 ENCOUNTER — Encounter (INDEPENDENT_AMBULATORY_CARE_PROVIDER_SITE_OTHER): Payer: Self-pay | Admitting: Orthopaedic Surgery

## 2017-06-05 ENCOUNTER — Ambulatory Visit (INDEPENDENT_AMBULATORY_CARE_PROVIDER_SITE_OTHER): Payer: Self-pay | Admitting: Orthopaedic Surgery

## 2017-06-05 DIAGNOSIS — M7062 Trochanteric bursitis, left hip: Secondary | ICD-10-CM | POA: Diagnosis not present

## 2017-06-05 MED ORDER — METHYLPREDNISOLONE ACETATE 40 MG/ML IJ SUSP
40.0000 mg | INTRAMUSCULAR | Status: AC | PRN
  Administered 2017-06-05: 40 mg via INTRA_ARTICULAR

## 2017-06-05 MED ORDER — LIDOCAINE HCL 1 % IJ SOLN
3.0000 mL | INTRAMUSCULAR | Status: AC | PRN
  Administered 2017-06-05: 3 mL

## 2017-06-05 NOTE — Progress Notes (Signed)
Office Visit Note   Patient: Rodney Hill.           Date of Birth: [birth year]           MRN: [id number] Visit Date: 06/05/2017              Requested by: Olena [place]ater, [place] [address] PCP: Olena Mater, MD   Assessment & Plan: Visit Diagnoses:  1. Trochanteric bursitis, left hip     Plan: I told him I do not mind trying one more steroid injection of the trochanteric area and he tolerated this well.  I showed him abundant stretching exercises to try and he needs to do this twice daily every day.  I told him to give it about 2-3 weeks or so and if his pain has not gotten better he needs outpatient formal physical therapy and he will let us know.  Otherwise follow-up as needed.  Follow-Up Instructions: Return if symptoms worsen or fail to improve.   Orders:  Orders Placed This Encounter  Procedures  . Large Joint Inj   No orders of the defined types were placed in this encounter.     Procedures: Large Joint Inj: L greater trochanter on 06/05/2017 1:31 PM Indications: pain and diagnostic evaluation Details: 22 G 1.5 in needle, lateral approach  Arthrogram: No  Medications: 3 mL lidocaine 1 %; 40 mg methylPREDNISolone acetate 40 MG/ML Outcome: tolerated well, no immediate complications Procedure, treatment alternatives, risks and benefits explained, specific risks discussed. Consent was given by the patient. Immediately prior to procedure a time out was called to verify the correct patient, procedure, equipment, support staff and site/side marked as required. Patient was prepped and draped in the usual sterile fashion.       Clinical Data: No additional findings.   Subjective: Chief Complaint  Patient presents with  . Left Hip - Pain  Patient is a very active 83 year old gentleman who comes in for evaluation treatment of left hip pain.  He actually has x-rays that accompany him.  This is only been hurting since October with no  known injury is on the lateral aspect of his hip that hurts.  He had one injection back in October and he said that helped greatly and this was done elsewhere over the lateral aspect of his hip.  He denies any groin pain.  He denies any back pain or numbness and tingling in his leg.  Is not diabetic.  He does not walk with assistive device.  HPI  Review of Systems He currently denies any headache, chest pain, shortness of breath, fever, chills, nausea, vomiting  Objective: Vital Signs: There were no vitals taken for this visit.  Physical Exam He is alert and oriented x3 and in no acute distress Ortho Exam Examination of his left hip shows fluid and full range of motion with no pain in the groin at all.  There is only pain over the trochanteric area. Specialty Comments:  No specialty comments available.  Imaging: No results found. X-rays independently reviewed that accompany him show normal-appearing left hip with no significant arthritic changes at all.  There are no acute findings otherwise.  PMFS History: Patient Active Problem List   Diagnosis Date Noted  . Trochanteric bursitis, left hip 06/05/2017   Past Medical History:  Diagnosis Date  . Arthritis   . Distal radius fracture, right     History reviewed. No pertinent family history.  Past Surgical History:  Procedure Laterality Date  .  APPENDECTOMY    . BACK SURGERY    . CARPAL TUNNEL RELEASE Right 02/21/2017   Procedure: RIGHT CARPAL TUNNEL RELEASE;  Surgeon: Daryll Brod, MD;  Location: [facility];  Service: Orthopedics;  Laterality: Right;  UPPERARM  . CERVICAL LAMINECTOMY    . EYE SURGERY Bilateral    cataract  . FOOT SURGERY Right    x2  . HERNIA REPAIR     UHR  . OPEN REDUCTION INTERNAL FIXATION (ORIF) DISTAL RADIAL FRACTURE Right 09/29/2016   Procedure: OPEN REDUCTION INTERNAL FIXATION (ORIF) RIGHT DISTAL RADIAL FRACTURE;  Surgeon: Daryll Brod, MD;  Location: [place];  Service:  Orthopedics;  Laterality: Right;  . PROSTATE SURGERY    . SHOULDER ARTHROSCOPY Bilateral   . TRIGGER FINGER RELEASE  02/21/2017   Procedure: RELEASE TRIGGER FINGER/A-1 PULLEY RIGHT SMALL FINGER;  Surgeon: Daryll Brod, MD;  Location: [facility];  Service: Orthopedics;;   Social History   Occupational History  . Not on file  Tobacco Use  . Smoking status: Never Smoker  . Smokeless tobacco: Never Used  Substance and Sexual Activity  . Alcohol use: No  . Drug use: No  . Sexual activity: Not on file

## 2017-08-03 ENCOUNTER — Other Ambulatory Visit (INDEPENDENT_AMBULATORY_CARE_PROVIDER_SITE_OTHER): Payer: Self-pay

## 2017-08-03 ENCOUNTER — Ambulatory Visit (INDEPENDENT_AMBULATORY_CARE_PROVIDER_SITE_OTHER): Payer: Medicare Other | Admitting: Orthopaedic Surgery

## 2017-08-03 ENCOUNTER — Encounter (INDEPENDENT_AMBULATORY_CARE_PROVIDER_SITE_OTHER): Payer: Self-pay | Admitting: Orthopaedic Surgery

## 2017-08-03 ENCOUNTER — Other Ambulatory Visit (INDEPENDENT_AMBULATORY_CARE_PROVIDER_SITE_OTHER): Payer: Self-pay | Admitting: Orthopaedic Surgery

## 2017-08-03 DIAGNOSIS — T1590XA Foreign body on external eye, part unspecified, unspecified eye, initial encounter: Secondary | ICD-10-CM

## 2017-08-03 DIAGNOSIS — M25562 Pain in left knee: Secondary | ICD-10-CM | POA: Diagnosis not present

## 2017-08-03 MED ORDER — LIDOCAINE HCL 1 % IJ SOLN
3.0000 mL | INTRAMUSCULAR | Status: AC | PRN
  Administered 2017-08-03: 3 mL

## 2017-08-03 MED ORDER — METHYLPREDNISOLONE ACETATE 40 MG/ML IJ SUSP
40.0000 mg | INTRAMUSCULAR | Status: AC | PRN
  Administered 2017-08-03: 40 mg via INTRA_ARTICULAR

## 2017-08-03 NOTE — Progress Notes (Signed)
Office Visit Note   Patient: Rodney Hill.           Date of Birth: 10-09-1934           MRN: [id number] Visit Date: 08/03/2017              Requested by: Olena Mater, [place] [address] PCP: Olena Mater, MD   Assessment & Plan: Visit Diagnoses:  1. Acute pain of left knee     Plan: Based on his mechanical symptoms I do feel an MRI is warranted to rule out a stress fracture of the knee or of missed fracture line that can be seen on plain films.  Especially since he cannot put weight on his knee at all.  I am also concerned about a meniscal tear giving the significant pain with pivoting activities.  I do feel the steroid injection appropriate to try to calm down his acute pain in his knee and putting him in the knee brace for support.  All questions and concerns were answered and addressed.  We will see him back once we have the MRI obtained.  Continue to be guarded with his activities.  Follow-Up Instructions: Return in about 2 weeks (around 08/17/2017).   Orders:  Orders Placed This Encounter  Procedures  . Large Joint Inj   No orders of the defined types were placed in this encounter.     Procedures: Large Joint Inj: L knee on 08/03/2017 1:17 PM Indications: diagnostic evaluation and pain Details: 22 G 1.5 in needle, superolateral approach  Arthrogram: No  Medications: 3 mL lidocaine 1 %; 40 mg methylPREDNISolone acetate 40 MG/ML Outcome: tolerated well, no immediate complications Procedure, treatment alternatives, risks and benefits explained, specific risks discussed. Consent was given by the patient. Immediately prior to procedure a time out was called to verify the correct patient, procedure, equipment, support staff and site/side marked as required. Patient was prepped and draped in the usual sterile fashion.       Clinical Data: No additional findings.   Subjective: Chief Complaint  Patient presents with  . Left Knee -  Follow-up  The patient is here today with acute left knee pain.  He is active 83 year old gentleman who fell a week ago Friday landing hard on his left knee.  Since then he has not been to put weight on his knee.  He went to urgent care center and they did obtain x-rays of his knee.  They are on the system for [place] to review today.  He does report pain across the joint line was where he points to on his left knee.  He denies any previous knee problems before this.  He is ambulating with a walker and he says twisting activities causing severe amount of pain.  HPI  Review of Systems He currently denies any headache, chest pain, shortness of breath, fever, chills, nausea, vomiting.  Objective: Vital Signs: There were no vitals taken for this visit.  Physical Exam He is alert and oriented x3 and in no acute distress Ortho Exam Examination of his left knee shows severe pain when I flex him past 90 degrees.  He has medial and lateral joint line tenderness as well but no effusion.  He does have significant pain when I rotate the tibia on the femur both medially and laterally. Specialty Comments:  No specialty comments available.  Imaging: No results found. X-rays that accompany him show a normal-appearing left knee in terms of no gross  fracture that I can see.  The medial lateral compartments actually well-maintained joint space was.  There is patellofemoral arthritic changes.  PMFS History: Patient Active Problem List   Diagnosis Date Noted  . Trochanteric bursitis, left hip 06/05/2017   Past Medical History:  Diagnosis Date  . Arthritis   . Distal radius fracture, right     History reviewed. No pertinent family history.  Past Surgical History:  Procedure Laterality Date  . APPENDECTOMY    . BACK SURGERY    . CARPAL TUNNEL RELEASE Right 02/21/2017   Procedure: RIGHT CARPAL TUNNEL RELEASE;  Surgeon: Daryll Brod, MD;  Location: [facility];  Service: Orthopedics;   Laterality: Right;  UPPERARM  . CERVICAL LAMINECTOMY    . EYE SURGERY Bilateral    cataract  . FOOT SURGERY Right    x2  . HERNIA REPAIR     UHR  . OPEN REDUCTION INTERNAL FIXATION (ORIF) DISTAL RADIAL FRACTURE Right 09/29/2016   Procedure: OPEN REDUCTION INTERNAL FIXATION (ORIF) RIGHT DISTAL RADIAL FRACTURE;  Surgeon: Daryll Brod, MD;  Location: [facility];  Service: Orthopedics;  Laterality: Right;  . PROSTATE SURGERY    . SHOULDER ARTHROSCOPY Bilateral   . TRIGGER FINGER RELEASE  02/21/2017   Procedure: RELEASE TRIGGER FINGER/A-1 PULLEY RIGHT SMALL FINGER;  Surgeon: Daryll Brod, MD;  Location: [facility];  Service: Orthopedics;;   Social History   Occupational History  . Not on file  Tobacco Use  . Smoking status: Never Smoker  . Smokeless tobacco: Never Used  Substance and Sexual Activity  . Alcohol use: No  . Drug use: No  . Sexual activity: Not on file

## 2017-08-04 ENCOUNTER — Ambulatory Visit
Admission: RE | Admit: 2017-08-04 | Discharge: 2017-08-04 | Disposition: A | Discharge status: Home / Self Care | Payer: Medicare Other | Source: Ambulatory Visit | Attending: Orthopaedic Surgery | Admitting: Orthopaedic Surgery

## 2017-08-04 DIAGNOSIS — T1590XA Foreign body on external eye, part unspecified, unspecified eye, initial encounter: Secondary | ICD-10-CM

## 2017-08-04 DIAGNOSIS — M25562 Pain in left knee: Secondary | ICD-10-CM

## 2017-08-08 ENCOUNTER — Telehealth (INDEPENDENT_AMBULATORY_CARE_PROVIDER_SITE_OTHER): Payer: Self-pay | Admitting: Orthopaedic Surgery

## 2017-08-08 NOTE — Telephone Encounter (Signed)
Patient called asking for the results of his MRI, states he's in a lot of pain tried to offer him an appointment on the 10th to go over results but wanted to be seen sooner. CB # [phone]

## 2017-08-08 NOTE — Telephone Encounter (Signed)
Please advise 

## 2017-08-15 ENCOUNTER — Ambulatory Visit (INDEPENDENT_AMBULATORY_CARE_PROVIDER_SITE_OTHER): Payer: Medicare Other | Admitting: Physician Assistant

## 2017-08-15 ENCOUNTER — Ambulatory Visit (INDEPENDENT_AMBULATORY_CARE_PROVIDER_SITE_OTHER): Payer: Medicare Other

## 2017-08-15 ENCOUNTER — Encounter (INDEPENDENT_AMBULATORY_CARE_PROVIDER_SITE_OTHER): Payer: Self-pay | Admitting: Physician Assistant

## 2017-08-15 DIAGNOSIS — M25562 Pain in left knee: Secondary | ICD-10-CM | POA: Diagnosis not present

## 2017-08-15 DIAGNOSIS — S82102D Unspecified fracture of upper end of left tibia, subsequent encounter for closed fracture with routine healing: Secondary | ICD-10-CM

## 2017-08-15 DIAGNOSIS — S83242D Other tear of medial meniscus, current injury, left knee, subsequent encounter: Secondary | ICD-10-CM

## 2017-08-15 NOTE — Progress Notes (Addendum)
Office Visit Note   Patient: Rodney Hill.           Date of Birth: Apr 13, 1934           MRN: [id number] Visit Date: 08/15/2017              Requested by: Rodney [place] [address] PCP: Rodney Mater, MD   Assessment & Plan: Visit Diagnoses:  1. Closed fracture of proximal end of left tibia with routine healing, unspecified fracture morphology, subsequent encounter   2. Acute medial meniscus tear of left knee, subsequent encounter     Plan: He will remain 50% weightbearing on the left leg.  Ice elevation encouraged.  Follow-up with [place] in 2 weeks repeat AP and lateral views of the left knee at that time.  Discussed with him the fact that the meniscal tear at this point time could not be excised by arthroscopy. .  The  fracture will need to heal then we will assess his knee to see if he has any mechanical symptoms requiring knee arthroscopy with partial medial meniscectomy.  Follow-Up Instructions: Return in about 2 weeks (around 08/29/2017) for Radiographs.   Orders:  Orders Placed This Encounter  Procedures  . XR Knee 1-2 Views Left   No orders of the defined types were placed in this encounter.     Procedures: No procedures performed   Clinical Data: No additional findings.   Subjective: Chief Complaint  Patient presents with  . Left Knee - Pain, Follow-up    HPI [name] comes in today to go over the MRI results of his left knee.  Patient fell on 07/27/2017 injuring his left knee.  He did receive a phone call from Dr. Ninfa Linden letting you know that he had a nondisplaced fracture of the proximal tibia.  He has been 50% weightbearing on the leg.  He is no longer using knee brace.  He is ambulating with a walker.  Said no shortness of breath fevers chills.   MRI images of the left knee were reviewed with the patient.  MRI dated 08/04/2017 showed nondisplaced fractures of the proximal tibia.  Horizontal tear of the posterior horn  of the medial meniscus.  Significant cartilage loss patella femoral compartment.  Moderate joint effusion. Review of Systems Please see HPI  Objective: Vital Signs: There were no vitals taken for this visit.  Physical Exam  Constitutional: He is oriented to person, place, and time. He appears well-developed and well-nourished. No distress.  Pulmonary/Chest: Effort normal.  Neurological: He is alert and oriented to person, place, and time.  Skin: He is not diaphoretic.  Psychiatric: He has a normal mood and affect.    Ortho Exam Left knee has full extension flexes to 90 degrees.  Has tenderness along medial lateral joint line of the knee.  Calf supple nontender. Specialty Comments:  No specialty comments available.  Imaging: Xr Knee 1-2 Views Left  Result Date: 08/15/2017 AP lateral views left knee: Shows the fracture appears to be nondisplaced.  There is some early consolidation seen on the lateral view.  AP view the fracture is barely visible.    PMFS History: Patient Active Problem List   Diagnosis Date Noted  . Trochanteric bursitis, left hip 06/05/2017   Past Medical History:  Diagnosis Date  . Arthritis   . Distal radius fracture, right     No family history on file.  Past Surgical History:  Procedure Laterality Date  . APPENDECTOMY    .  BACK SURGERY    . CARPAL TUNNEL RELEASE Right 02/21/2017   Procedure: RIGHT CARPAL TUNNEL RELEASE;  Surgeon: Daryll Brod, MD;  Location: [facility];  Service: Orthopedics;  Laterality: Right;  UPPERARM  . CERVICAL LAMINECTOMY    . EYE SURGERY Bilateral    cataract  . FOOT SURGERY Right    x2  . HERNIA REPAIR     UHR  . OPEN REDUCTION INTERNAL FIXATION (ORIF) DISTAL RADIAL FRACTURE Right 09/29/2016   Procedure: OPEN REDUCTION INTERNAL FIXATION (ORIF) RIGHT DISTAL RADIAL FRACTURE;  Surgeon: Daryll Brod, MD;  Location: [facility];  Service: Orthopedics;  Laterality: Right;  . PROSTATE SURGERY    .  SHOULDER ARTHROSCOPY Bilateral   . TRIGGER FINGER RELEASE  02/21/2017   Procedure: RELEASE TRIGGER FINGER/A-1 PULLEY RIGHT SMALL FINGER;  Surgeon: Daryll Brod, MD;  Location: [facility];  Service: Orthopedics;;   Social History   Occupational History  . Not on file  Tobacco Use  . Smoking status: Never Smoker  . Smokeless tobacco: Never Used  Substance and Sexual Activity  . Alcohol use: No  . Drug use: No  . Sexual activity: Not on file

## 2017-08-17 ENCOUNTER — Ambulatory Visit (INDEPENDENT_AMBULATORY_CARE_PROVIDER_SITE_OTHER): Payer: Medicare Other | Admitting: Physician Assistant

## 2017-08-30 ENCOUNTER — Ambulatory Visit (INDEPENDENT_AMBULATORY_CARE_PROVIDER_SITE_OTHER): Payer: Medicare Other | Admitting: Physician Assistant

## 2017-08-30 ENCOUNTER — Ambulatory Visit (INDEPENDENT_AMBULATORY_CARE_PROVIDER_SITE_OTHER): Payer: Medicare Other

## 2017-08-30 ENCOUNTER — Encounter (INDEPENDENT_AMBULATORY_CARE_PROVIDER_SITE_OTHER): Payer: Self-pay | Admitting: Physician Assistant

## 2017-08-30 DIAGNOSIS — M25572 Pain in left ankle and joints of left foot: Secondary | ICD-10-CM

## 2017-08-30 DIAGNOSIS — S82102D Unspecified fracture of upper end of left tibia, subsequent encounter for closed fracture with routine healing: Secondary | ICD-10-CM

## 2017-08-30 DIAGNOSIS — M25562 Pain in left knee: Secondary | ICD-10-CM

## 2017-08-30 MED ORDER — METHYLPREDNISOLONE ACETATE 40 MG/ML IJ SUSP
40.0000 mg | INTRAMUSCULAR | Status: AC | PRN
  Administered 2017-08-30: 40 mg via INTRA_ARTICULAR

## 2017-08-30 MED ORDER — LIDOCAINE HCL 1 % IJ SOLN
2.0000 mL | INTRAMUSCULAR | Status: AC | PRN
  Administered 2017-08-30: 2 mL

## 2017-08-30 NOTE — Progress Notes (Signed)
Office Visit Note   Patient: Rodney Hill.           Date of Birth: 08/30/1934           MRN: [id number] Visit Date: 08/30/2017              Requested by: Rodney [place]ater, [place] [address] PCP: Rodney Mater, MD   Assessment & Plan: Visit Diagnoses:  1. Acute pain of left knee   2. Closed fracture of proximal end of left tibia with routine healing, unspecified fracture morphology, subsequent encounter   3. Acute left ankle pain     Plan: He can go to weightbearing as tolerated on the left knee with a cane in his right hand.  In regards to his left ankle we will see him back in a month to recheck his ankle for the response to the injection.  He is to be mindful of any catching locking giving way of the ankle.  His pain continues despite conservative treatment of the ankle recommend MRI to rule out osteochondral lesion talus.  Follow-Up Instructions: Return in about 1 month (around 09/27/2017).   Orders:  Orders Placed This Encounter  Procedures  . Medium Joint Inj  . XR KNEE 3 VIEW LEFT  . XR Ankle 2 Views Left   No orders of the defined types were placed in this encounter.     Procedures: Medium Joint Inj on 08/30/2017 3:37 PM Indications: pain Details: anterolateral approach Medications: 2 mL lidocaine 1 %; 40 mg methylPREDNISolone acetate 40 MG/ML Consent was given by the patient. Immediately prior to procedure a time out was called to verify the correct patient, procedure, equipment, support staff and site/side marked as required. Patient was prepped and draped in the usual sterile fashion.       Clinical Data: No additional findings.   Subjective: Chief Complaint  Patient presents with  . Left Knee - Follow-up    HPI Rodney Hill returns today for follow-up of his left knee.  He states his left knee pain is overall improving.  He is now 25 days status post fall in which she sustained a nondisplaced fracture of his proximal  tibia.  He is also had left ankle pain since that time.  He is having swelling in the ankle.  Denies any mechanical symptoms.  He would like for [place] to take a look at the ankle.  States he just feels the ankle is swollen and uncomfortable but no significant pain.  The discomfort he feels in the anterior medial aspect of the ankle. Review of Systems Please see HPI otherwise negative  Objective: Vital Signs: There were no vitals taken for this visit.  Physical Exam General: Well-developed well-nourished male in no acute distress mood and affect appropriate. Ortho Exam Left knee has tenderness over the tibial plateau region.  No tenderness over the medial lateral joint line.  He has full extension flexion 90 degrees.  No effusion abnormal warmth erythema.  Left ankle is good dorsiflexion plantarflexion ankle.  No tenderness over the Achilles.  Foot is nontender throughout.  Tenderness to the anterior medial ankle.  Dorsal pedal pulses intact  Specialty Comments:  No specialty comments available.  Imaging: Xr Ankle 2 Views Left  Result Date: 08/30/2017 Left ankle AP and lateral views shows talus well located within the ankle mortise.  There is an area of questionable osteochondral lesion of the most medial aspect of the talus.  No acute fractures or bony  abnormalities otherwise.  Xr Knee 3 View Left  Result Date: 08/30/2017 Left knee: 3 views show sclerotic activity in the tibial plateau fracture.  No change in overall position alignment fracture remains nondisplaced.  No other acute findings.    PMFS History: Patient Active Problem List   Diagnosis Date Noted  . Trochanteric bursitis, left hip 06/05/2017   Past Medical History:  Diagnosis Date  . Arthritis   . Distal radius fracture, right     No family history on file.  Past Surgical History:  Procedure Laterality Date  . APPENDECTOMY    . BACK SURGERY    . CARPAL TUNNEL RELEASE Right 02/21/2017   Procedure: RIGHT CARPAL TUNNEL  RELEASE;  Surgeon: Rodney Brod, MD;  Location: [facility];  Service: Orthopedics;  Laterality: Right;  UPPERARM  . CERVICAL LAMINECTOMY    . EYE SURGERY Bilateral    cataract  . FOOT SURGERY Right    x2  . HERNIA REPAIR     UHR  . OPEN REDUCTION INTERNAL FIXATION (ORIF) DISTAL RADIAL FRACTURE Right 09/29/2016   Procedure: OPEN REDUCTION INTERNAL FIXATION (ORIF) RIGHT DISTAL RADIAL FRACTURE;  Surgeon: Rodney Brod, MD;  Location: [facility];  Service: Orthopedics;  Laterality: Right;  . PROSTATE SURGERY    . SHOULDER ARTHROSCOPY Bilateral   . TRIGGER FINGER RELEASE  02/21/2017   Procedure: RELEASE TRIGGER FINGER/A-1 PULLEY RIGHT SMALL FINGER;  Surgeon: Rodney Brod, MD;  Location: [facility];  Service: Orthopedics;;   Social History   Occupational History  . Not on file  Tobacco Use  . Smoking status: Never Smoker  . Smokeless tobacco: Never Used  Substance and Sexual Activity  . Alcohol use: No  . Drug use: No  . Sexual activity: Not on file

## 2017-09-13 DIAGNOSIS — D126 Benign neoplasm of colon, unspecified: Secondary | ICD-10-CM | POA: Insufficient documentation

## 2017-09-18 ENCOUNTER — Telehealth (INDEPENDENT_AMBULATORY_CARE_PROVIDER_SITE_OTHER): Payer: Self-pay | Admitting: Physician Assistant

## 2017-09-18 NOTE — Telephone Encounter (Signed)
Patient called stated you seen him last and told him if ankle was not any better to call you so you can order MRI  [phone]  Please call him to advise.  Advised I could get Rodney Hill to call him back after referral. He said no he would rather discuss with you.

## 2017-09-19 NOTE — Telephone Encounter (Signed)
Will you get a MRI of his left ankle to rule out osteochondral lesion of his talus . Please call him as he is in [place] , so I am not sure where he will need to go to get the study.

## 2017-09-20 ENCOUNTER — Other Ambulatory Visit (INDEPENDENT_AMBULATORY_CARE_PROVIDER_SITE_OTHER): Payer: Self-pay | Admitting: Physician Assistant

## 2017-09-20 DIAGNOSIS — M949 Disorder of cartilage, unspecified: Principal | ICD-10-CM

## 2017-09-20 DIAGNOSIS — M899 Disorder of bone, unspecified: Secondary | ICD-10-CM

## 2017-09-20 NOTE — Telephone Encounter (Signed)
Order has been placed for MRI ankle

## 2017-09-27 ENCOUNTER — Ambulatory Visit (INDEPENDENT_AMBULATORY_CARE_PROVIDER_SITE_OTHER): Payer: Medicare Other | Admitting: Physician Assistant

## 2017-10-04 ENCOUNTER — Ambulatory Visit (INDEPENDENT_AMBULATORY_CARE_PROVIDER_SITE_OTHER): Payer: Medicare Other | Admitting: Physician Assistant

## 2017-10-04 ENCOUNTER — Ambulatory Visit (INDEPENDENT_AMBULATORY_CARE_PROVIDER_SITE_OTHER): Payer: Medicare Other

## 2017-10-04 ENCOUNTER — Encounter (INDEPENDENT_AMBULATORY_CARE_PROVIDER_SITE_OTHER): Payer: Self-pay | Admitting: Physician Assistant

## 2017-10-04 DIAGNOSIS — S82102D Unspecified fracture of upper end of left tibia, subsequent encounter for closed fracture with routine healing: Secondary | ICD-10-CM

## 2017-10-04 DIAGNOSIS — M25572 Pain in left ankle and joints of left foot: Secondary | ICD-10-CM

## 2017-10-04 MED ORDER — METHYLPREDNISOLONE 4 MG PO TABS: ORAL_TABLET | ORAL | 0 refills | Status: DC

## 2017-10-04 NOTE — Progress Notes (Signed)
Office Visit Note   Patient: Rodney Hill.           Date of Birth: May 10, 1934           MRN: [id number] Visit Date: 10/04/2017              Requested by: Olena [place]ater, [place] [address] PCP: Olena Mater, MD   Assessment & Plan: Visit Diagnoses:  1. Closed fracture of proximal end of left tibia with routine healing, unspecified fracture morphology, subsequent encounter   2. Acute left ankle pain     Plan:  We will send him to physical therapy for range of motion home exercise program and modalities for the left ankle peroneal tendinitis and posterior tibial tendinitis.  He was placed on a Medrol Dosepak.  In regards to his left knee he is activities as tolerated.  If he develops any mechanical symptoms or develops any effusions of the knee he will call our office and we will set him up for a left knee arthroscopy with partial meniscectomy.  However we would not perform this until he is at least 4 months out from the injury in which she sustained a closed proximal tibia fracture. Questions were encouraged and answered at length. Follow-Up Instructions: Return if symptoms worsen or fail to improve.   Orders:  Orders Placed This Encounter  Procedures  . XR Knee 1-2 Views Left   Meds ordered this encounter  Medications  . methylPREDNISolone (MEDROL) 4 MG tablet    Sig: Take as directed    Dispense:  21 tablet    Refill:  0      Procedures: No procedures performed   Clinical Data: No additional findings.   Subjective: Chief Complaint  Patient presents with  . Left Ankle - Follow-up    HPI [name] returns today to go over the MRI of his left ankle.  He continues to have pain in the ankle.  States the cortisone injection really did not help with the pain.  In regards to his left proximal tibia fracture he states that overall things are improving.  He denies any mechanical symptoms of the knee or the ankle.  He is now 10 weeks  status post fall in which he injured his left knee and ankle. He brings with him a CD and report from an outside facility of the left ankle.  These are reviewed with the patient and show no acute fracture no osteochondral lesion.  Main finding is tendinitis involving the peroneal tendons and the posterior tibial tendon.  Also flexor hallux longus tendinitis. Review of Systems Please see HPI otherwise negative  Objective: Vital Signs: There were no vitals taken for this visit.  Physical Exam  Constitutional: He is oriented to person, place, and time. He appears well-developed and well-nourished. No distress.  Pulmonary/Chest: Effort normal.  Neurological: He is alert and oriented to person, place, and time.  Skin: He is not diaphoretic.    Ortho Exam Left ankle he has good dorsiflexion plantarflexion.  He has tenderness over the posterior tibial tendon peroneal tendons and also of the anterior lateral aspect of the ankle.  Inversion inversion of the left foot against resistance reveals 5 out of 5 strength however he has pain with eversion of the left foot against resistance.  Left knee good range of motion.  No effusion abnormal warmth erythema of the left knee. Specialty Comments:  No specialty comments available.  Imaging: Xr Knee 1-2 Views Left  Result Date: 10/04/2017 Left knee AP lateral views: Proximal tibia fracture is healed.  No other fractures identified.    PMFS History: Patient Active Problem List   Diagnosis Date Noted  . Trochanteric bursitis, left hip 06/05/2017   Past Medical History:  Diagnosis Date  . Arthritis   . Distal radius fracture, right     History reviewed. No pertinent family history.  Past Surgical History:  Procedure Laterality Date  . APPENDECTOMY    . BACK SURGERY    . CARPAL TUNNEL RELEASE Right 02/21/2017   Procedure: RIGHT CARPAL TUNNEL RELEASE;  Surgeon: Daryll Brod, MD;  Location: [place];  Service: Orthopedics;   Laterality: Right;  UPPERARM  . CERVICAL LAMINECTOMY    . EYE SURGERY Bilateral    cataract  . FOOT SURGERY Right    x2  . HERNIA REPAIR     UHR  . OPEN REDUCTION INTERNAL FIXATION (ORIF) DISTAL RADIAL FRACTURE Right 09/29/2016   Procedure: OPEN REDUCTION INTERNAL FIXATION (ORIF) RIGHT DISTAL RADIAL FRACTURE;  Surgeon: Daryll Brod, MD;  Location: [place];  Service: Orthopedics;  Laterality: Right;  . PROSTATE SURGERY    . SHOULDER ARTHROSCOPY Bilateral   . TRIGGER FINGER RELEASE  02/21/2017   Procedure: RELEASE TRIGGER FINGER/A-1 PULLEY RIGHT SMALL FINGER;  Surgeon: Daryll Brod, MD;  Location: [place];  Service: Orthopedics;;   Social History   Occupational History  . Not on file  Tobacco Use  . Smoking status: Never Smoker  . Smokeless tobacco: Never Used  Substance and Sexual Activity  . Alcohol use: No  . Drug use: No  . Sexual activity: Not on file

## 2017-12-08 DIAGNOSIS — S66812A Strain of other specified muscles, fascia and tendons at wrist and hand level, left hand, initial encounter: Secondary | ICD-10-CM | POA: Insufficient documentation

## 2018-02-19 ENCOUNTER — Encounter (HOSPITAL_BASED_OUTPATIENT_CLINIC_OR_DEPARTMENT_OTHER): Payer: Self-pay | Admitting: *Deleted

## 2018-02-19 ENCOUNTER — Other Ambulatory Visit: Payer: Self-pay | Admitting: Orthopedic Surgery

## 2018-02-19 ENCOUNTER — Other Ambulatory Visit: Payer: Self-pay

## 2018-02-20 ENCOUNTER — Ambulatory Visit (HOSPITAL_BASED_OUTPATIENT_CLINIC_OR_DEPARTMENT_OTHER): Payer: Medicare Other | Admitting: Anesthesiology

## 2018-02-20 ENCOUNTER — Encounter (HOSPITAL_BASED_OUTPATIENT_CLINIC_OR_DEPARTMENT_OTHER): Payer: Self-pay | Admitting: Certified Registered"

## 2018-02-20 ENCOUNTER — Ambulatory Visit (HOSPITAL_BASED_OUTPATIENT_CLINIC_OR_DEPARTMENT_OTHER)
Admission: RE | Admit: 2018-02-20 | Discharge: 2018-02-20 | Disposition: A | Discharge status: Home / Self Care | Payer: Medicare Other | Attending: Orthopedic Surgery | Admitting: Orthopedic Surgery

## 2018-02-20 ENCOUNTER — Encounter (HOSPITAL_BASED_OUTPATIENT_CLINIC_OR_DEPARTMENT_OTHER)
Admission: RE | Disposition: A | Discharge status: Home / Self Care | Payer: Self-pay | Source: Home / Self Care | Attending: Orthopedic Surgery

## 2018-02-20 ENCOUNTER — Other Ambulatory Visit: Payer: Self-pay

## 2018-02-20 DIAGNOSIS — X58XXXA Exposure to other specified factors, initial encounter: Secondary | ICD-10-CM | POA: Diagnosis not present

## 2018-02-20 DIAGNOSIS — S63203A Unspecified subluxation of left middle finger, initial encounter: Secondary | ICD-10-CM | POA: Insufficient documentation

## 2018-02-20 HISTORY — DX: Other specified postprocedural states: Z98.890

## 2018-02-20 HISTORY — DX: Strain of extensor muscle, fascia and tendon of finger, unspecified finger at forearm level, initial encounter: S56.419A

## 2018-02-20 HISTORY — DX: Other specified postprocedural states: R11.2

## 2018-02-20 HISTORY — PX: REPAIR EXTENSOR TENDON: SHX5382

## 2018-02-20 SURGERY — REPAIR, TENDON, EXTENSOR
Anesthesia: Regional | Site: Finger | Laterality: Left

## 2018-02-20 MED ORDER — BUPIVACAINE HCL (PF) 0.25 % IJ SOLN
INTRAMUSCULAR | Status: DC | PRN
  Administered 2018-02-20: 4 mL

## 2018-02-20 MED ORDER — TRAMADOL HCL 50 MG PO TABS: 50.0000 mg | ORAL_TABLET | Freq: Four times a day (QID) | ORAL | 0 refills | Status: AC | PRN

## 2018-02-20 MED ORDER — CHLORHEXIDINE GLUCONATE 4 % EX LIQD: 60.0000 mL | Freq: Once | CUTANEOUS | Status: DC

## 2018-02-20 MED ORDER — FENTANYL CITRATE (PF) 100 MCG/2ML IJ SOLN: 50.0000 ug | INTRAMUSCULAR | Status: DC | PRN

## 2018-02-20 MED ORDER — CEFAZOLIN SODIUM-DEXTROSE 2-4 GM/100ML-% IV SOLN
INTRAVENOUS | Status: AC
  Filled 2018-02-20: qty 100

## 2018-02-20 MED ORDER — CEFAZOLIN SODIUM-DEXTROSE 2-4 GM/100ML-% IV SOLN
2.0000 g | INTRAVENOUS | Status: AC
  Administered 2018-02-20: 2 g via INTRAVENOUS

## 2018-02-20 MED ORDER — SCOPOLAMINE 1 MG/3DAYS TD PT72: 1.0000 | MEDICATED_PATCH | Freq: Once | TRANSDERMAL | Status: DC | PRN

## 2018-02-20 MED ORDER — PROPOFOL 10 MG/ML IV BOLUS
INTRAVENOUS | Status: AC
  Filled 2018-02-20: qty 20

## 2018-02-20 MED ORDER — ONDANSETRON HCL 4 MG/2ML IJ SOLN
INTRAMUSCULAR | Status: DC | PRN
  Administered 2018-02-20: 4 mg via INTRAVENOUS

## 2018-02-20 MED ORDER — PROPOFOL 500 MG/50ML IV EMUL
INTRAVENOUS | Status: DC | PRN
  Administered 2018-02-20: 75 ug/kg/min via INTRAVENOUS

## 2018-02-20 MED ORDER — ONDANSETRON HCL 4 MG/2ML IJ SOLN
INTRAMUSCULAR | Status: AC
  Filled 2018-02-20: qty 2

## 2018-02-20 MED ORDER — LIDOCAINE HCL (PF) 0.5 % IJ SOLN
INTRAMUSCULAR | Status: DC | PRN
  Administered 2018-02-20: 30 mL via INTRAVENOUS

## 2018-02-20 MED ORDER — LACTATED RINGERS IV SOLN
INTRAVENOUS | Status: DC
  Administered 2018-02-20: 14:00:00 via INTRAVENOUS

## 2018-02-20 MED ORDER — MIDAZOLAM HCL 2 MG/2ML IJ SOLN: 1.0000 mg | INTRAMUSCULAR | Status: DC | PRN

## 2018-02-20 MED ORDER — FENTANYL CITRATE (PF) 100 MCG/2ML IJ SOLN
INTRAMUSCULAR | Status: DC | PRN
  Administered 2018-02-20 (×4): 25 ug via INTRAVENOUS

## 2018-02-20 MED ORDER — FENTANYL CITRATE (PF) 100 MCG/2ML IJ SOLN
INTRAMUSCULAR | Status: AC
  Filled 2018-02-20: qty 2

## 2018-02-20 SURGICAL SUPPLY — 64 items
BAG DECANTER FOR FLEXI CONT (MISCELLANEOUS) IMPLANT
BLADE MINI RND TIP GREEN BEAV (BLADE) ×3 IMPLANT
BLADE SURG 15 STRL LF DISP TIS (BLADE) ×1 IMPLANT
BLADE SURG 15 STRL SS (BLADE) ×2
BNDG COHESIVE 3X5 TAN STRL LF (GAUZE/BANDAGES/DRESSINGS) ×3 IMPLANT
BNDG ESMARK 4X9 LF (GAUZE/BANDAGES/DRESSINGS) IMPLANT
BNDG GAUZE ELAST 4 BULKY (GAUZE/BANDAGES/DRESSINGS) ×3 IMPLANT
CHLORAPREP W/TINT 26ML (MISCELLANEOUS) ×3 IMPLANT
CORD BIPOLAR FORCEPS 12FT (ELECTRODE) ×3 IMPLANT
COVER BACK TABLE 60X90IN (DRAPES) ×3 IMPLANT
COVER MAYO STAND STRL (DRAPES) ×3 IMPLANT
COVER WAND RF STERILE (DRAPES) IMPLANT
CUFF TOURNIQUET SINGLE 18IN (TOURNIQUET CUFF) ×3 IMPLANT
DECANTER SPIKE VIAL GLASS SM (MISCELLANEOUS) IMPLANT
DRAIN TLS ROUND 10FR (DRAIN) IMPLANT
DRAPE EXTREMITY T 121X128X90 (DRAPE) ×3 IMPLANT
DRAPE OEC MINIVIEW 54X84 (DRAPES) IMPLANT
DRAPE SURG 17X23 STRL (DRAPES) ×3 IMPLANT
GAUZE SPONGE 4X4 12PLY STRL (GAUZE/BANDAGES/DRESSINGS) ×3 IMPLANT
GAUZE XEROFORM 1X8 LF (GAUZE/BANDAGES/DRESSINGS) ×3 IMPLANT
GLOVE BIOGEL PI IND STRL 8.5 (GLOVE) ×1 IMPLANT
GLOVE BIOGEL PI INDICATOR 8.5 (GLOVE) ×2
GLOVE ECLIPSE 6.5 STRL STRAW (GLOVE) ×6 IMPLANT
GLOVE SURG ORTHO 8.0 STRL STRW (GLOVE) ×3 IMPLANT
GOWN STRL REUS W/ TWL LRG LVL3 (GOWN DISPOSABLE) ×1 IMPLANT
GOWN STRL REUS W/TWL LRG LVL3 (GOWN DISPOSABLE) ×2
GOWN STRL REUS W/TWL XL LVL3 (GOWN DISPOSABLE) ×3 IMPLANT
K-WIRE .035X4 (WIRE) IMPLANT
LOOP VESSEL MAXI BLUE (MISCELLANEOUS) IMPLANT
NEEDLE HYPO 22GX1.5 SAFETY (NEEDLE) IMPLANT
NEEDLE KEITH (NEEDLE) IMPLANT
NEEDLE PRECISIONGLIDE 27X1.5 (NEEDLE) IMPLANT
NS IRRIG 1000ML POUR BTL (IV SOLUTION) ×3 IMPLANT
PACK BASIN DAY SURGERY FS (CUSTOM PROCEDURE TRAY) ×3 IMPLANT
PAD CAST 3X4 CTTN HI CHSV (CAST SUPPLIES) ×1 IMPLANT
PADDING CAST ABS 3INX4YD NS (CAST SUPPLIES)
PADDING CAST ABS 4INX4YD NS (CAST SUPPLIES) ×2
PADDING CAST ABS COTTON 3X4 (CAST SUPPLIES) IMPLANT
PADDING CAST ABS COTTON 4X4 ST (CAST SUPPLIES) ×1 IMPLANT
PADDING CAST COTTON 3X4 STRL (CAST SUPPLIES) ×2
SLEEVE SCD COMPRESS KNEE MED (MISCELLANEOUS) IMPLANT
SPLINT PLASTER CAST XFAST 3X15 (CAST SUPPLIES) IMPLANT
SPLINT PLASTER XTRA FASTSET 3X (CAST SUPPLIES)
STOCKINETTE 4X48 STRL (DRAPES) ×3 IMPLANT
SUT CHROMIC 5 0 P 3 (SUTURE) IMPLANT
SUT ETHIBOND 3-0 V-5 (SUTURE) IMPLANT
SUT ETHILON 4 0 PS 2 18 (SUTURE) ×3 IMPLANT
SUT ETHILON 5 0 PC 1 (SUTURE) ×3 IMPLANT
SUT FIBERWIRE 2-0 18 17.9 3/8 (SUTURE)
SUT FIBERWIRE 4-0 18 TAPR NDL (SUTURE)
SUT MERSILENE 4 0 P 3 (SUTURE) IMPLANT
SUT PROLENE 2 0 SH DA (SUTURE) IMPLANT
SUT SILK 4 0 PS 2 (SUTURE) IMPLANT
SUT VIC AB 3-0 PS1 18 (SUTURE)
SUT VIC AB 3-0 PS1 18XBRD (SUTURE) IMPLANT
SUT VIC AB 4-0 P-3 18XBRD (SUTURE) IMPLANT
SUT VIC AB 4-0 P3 18 (SUTURE)
SUT VICRYL 4-0 PS2 18IN ABS (SUTURE) IMPLANT
SUTURE FIBERWR 2-0 18 17.9 3/8 (SUTURE) IMPLANT
SUTURE FIBERWR 4-0 18 TAPR NDL (SUTURE) IMPLANT
SYR BULB 3OZ (MISCELLANEOUS) ×3 IMPLANT
SYR CONTROL 10ML LL (SYRINGE) IMPLANT
TOWEL GREEN STERILE FF (TOWEL DISPOSABLE) ×6 IMPLANT
UNDERPAD 30X30 (UNDERPADS AND DIAPERS) ×3 IMPLANT

## 2018-02-20 NOTE — Anesthesia Preprocedure Evaluation (Signed)
Anesthesia Evaluation  Patient identified by MRN, date of birth, ID band Patient awake    Reviewed: Allergy & Precautions, NPO status , Patient's Chart, lab work & pertinent test results  History of Anesthesia Complications (+) PONV and history of anesthetic complications  Airway Mallampati: II  TM Distance: >3 FB Neck ROM: Full    Dental  (+) Dental Advisory Given   Pulmonary neg pulmonary ROS,    Pulmonary exam normal breath sounds clear to auscultation       Cardiovascular negative cardio ROS Normal cardiovascular exam Rhythm:Regular Rate:Normal     Neuro/Psych negative neurological ROS  negative psych ROS   GI/Hepatic negative GI ROS, Neg liver ROS,   Endo/Other  negative endocrine ROS  Renal/GU negative Renal ROS     Musculoskeletal negative musculoskeletal ROS (+) Arthritis ,   Abdominal   Peds  Hematology negative hematology ROS (+)   Anesthesia Other Findings   Reproductive/Obstetrics negative OB ROS                            Anesthesia Physical  Anesthesia Plan  ASA: II  Anesthesia Plan: Bier Block and Bier Block-LIDOCAINE ONLY   Post-op Pain Management:    Induction: Intravenous  PONV Risk Score and Plan: 0  Airway Management Planned: Simple Face Mask  Additional Equipment:   Intra-op Plan:   Post-operative Plan:   Informed Consent: I have reviewed the patients History and Physical, chart, labs and discussed the procedure including the risks, benefits and alternatives for the proposed anesthesia with the patient or authorized representative who has indicated his/her understanding and acceptance.   Dental advisory given  Plan Discussed with: CRNA  Anesthesia Plan Comments:        Anesthesia Quick Evaluation

## 2018-02-20 NOTE — Op Note (Signed)
NAME: Rodney Hill. 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   ASSISTANT: none   ANESTHESIA:  Bier block with sedation and Local   INTRAVENOUS FLUIDS:  Per anesthesia flow sheet.   ESTIMATED BLOOD LOSS:  Minimal.   COMPLICATIONS:  None.   SPECIMENS:  none   TOURNIQUET TIME:    Total Tourniquet Time Documented: Upper Arm (Left) - 30 minutes Total: Upper Arm (Left) - 30 minutes    DISPOSITION:  Stable to PACU.   INDICATIONS: Patient is a an 83 year old male who sustained a subluxation of the extensor tendon into the ulnar gutter of his left middle finger.  He has undergone a course of conservative treatment for this which has not availed any significant relief of symptoms with continued subluxation.  This has continued to give him discomfort with continued catching.  He has elected to undergo surgical reconstruction.  Pre-peri-and postoperative course been discussed along with risks and complications.  He is aware that there is no guarantee to the surgery the possibility of infection recurrence injury to arteries nerves tendons and complete relief symptoms distally in the preoperative area the patient has seen the extremity marked by both patient and surgeon antibiotic given  OPERATIVE COURSE: Patient is brought to the operating room where a forearm-based IV regional anesthetic was carried out without difficulty was prepped using ChloraPrep in a supine position with a left arm free a three-minute dry time was allowed and a timeout taken to confirm  patient procedure.  A longitudinal incision incision was made slightly curved to the radial aspect of the metacarpal phalangeal joint of the left middle finger carried down through subcutaneous tissue.  Bleeders were electrocauterized with bipolar.  He continued to have some sensation.  A dorsal block was given of both radial and ulnar nerves.  Was done with quarter percent bupivacaine without epinephrine approximately 5 cc was used.  The scarring was significant on the ulnar aspect of his extensor hood there was no healing on the radial aspect.  He had a sizable juncture to the ring finger.  This was harvested distally for approximately one half of it and then carried through the ulnar quarter of the extensor tendon to the middle finger.  This was dissected free to the level of the metacarpal head distally.  The underlying scar from the extensor tendon was debrided away from the capsule the juncture and portion of the extensor tendon was then put through the central aspect of the extensor tendon after piercing this with a Beaver blade and a hemostat was used to deliver the tendon from the ulnar to the radial side.  This was then brought around the intermetacarpal ligament with a right angle hemostat.  And brought back onto itself where it was sutured with multiple figure-of-eight 4-0 Mersilene sutures.  This maintained the central tendon directly over the head of the metacarpal in both extension and full passive flexion.  Wound was copiously irrigated with saline.  The skin was closed interrupted 4-0 nylon sutures.  A sterile compressive dressing splint to all 4 fingers  was applied.  Deflation of the tourniquet all fingers immediately pink.  He was taken to the recovery room for observation in satisfactory condition.  He will be discharged home to return the [facility] in 1 week on tramadol.   Rodney Brod, MD Electronically signed, 02/20/18

## 2018-02-20 NOTE — Brief Op Note (Signed)
02/20/2018  2:38 PM  PATIENT:  Rodney Hill.  83 y.o. male  PRE-OPERATIVE DIAGNOSIS:  EXTENSOR TENDON RUPTURE LEFT MIDDLE FINGER  POST-OPERATIVE DIAGNOSIS:  EXTENSOR TENDON RUPTURE LEFT MIDDLE   PROCEDURE:  Procedure(s): RECONSTRUCTION  EXTENSOR TENDON  LEFT MIDDLE FINGER (Left)  SURGEON:  Surgeon(s) and Role:    * Daryll Brod, MD - Primary  PHYSICIAN ASSISTANT:   ASSISTANTS: none   ANESTHESIA:   local, regional and IV sedation  EBL: 32ml  BLOOD ADMINISTERED:none  DRAINS: none   LOCAL MEDICATIONS USED:  BUPIVICAINE   SPECIMEN:  No Specimen  DISPOSITION OF SPECIMEN:  N/A  COUNTS:  YES  TOURNIQUET:   Total Tourniquet Time Documented: Upper Arm (Left) - 30 minutes Total: Upper Arm (Left) - 30 minutes   DICTATION: .Viviann Spare Dictation  PLAN OF CARE: Discharge to home after PACU  PATIENT DISPOSITION:  PACU - hemodynamically stable.

## 2018-02-20 NOTE — Transfer of Care (Signed)
Immediate Anesthesia Transfer of Care Note  Patient: Rodney Hill.  Procedure(s) Performed: RECONSTRUCTION  EXTENSOR TENDON  LEFT MIDDLE FINGER (Left Finger)  Patient Location: PACU  Anesthesia Type:MAC and Bier block  Level of Consciousness: drowsy and patient cooperative  Airway & Oxygen Therapy: Patient Spontanous Breathing  Post-op Assessment: Report given to RN and Post -op Vital signs reviewed and stable  Post vital signs: Reviewed and stable  Last Vitals:  Vitals Value Taken Time  BP 128/75 02/20/2018  2:41 PM  Temp    Pulse 69 02/20/2018  2:43 PM  Resp 19 02/20/2018  2:43 PM  SpO2 96 % 02/20/2018  2:43 PM  Vitals shown include unvalidated device data.  Last Pain:  Vitals:   02/20/18 1330  TempSrc: Oral         Complications: No apparent anesthesia complications

## 2018-02-20 NOTE — H&P (Signed)
Rodney Hill. is an 83 y.o. male.   Chief Complaint: catching left middle finger HPI: Rodney Hill i is an 83 year old male with a history of triggering with subluxation the extensor tendon left middle finger.  This been treated conservatively with splinting.  This is has not responded to conservative treatment and he has had tenured triggering catching of the finger that this causes mild discomfort for him.  He has no prior history of injury.  He has undergone carpal tunnel releases in the past.  He has had trigger finger release in the past. Past Medical History:  Diagnosis Date  . Arthritis   . Distal radius fracture, right   . PONV (postoperative nausea and vomiting)    nauseated after colonoscopy in sept-2019  . Rupture of extensor tendon of finger    left middle finger    Past Surgical History:  Procedure Laterality Date  . APPENDECTOMY    . BACK SURGERY    . CARPAL TUNNEL RELEASE Right 02/21/2017   Procedure: RIGHT CARPAL TUNNEL RELEASE;  Surgeon: Daryll Brod, MD;  Location: [place];  Service: Orthopedics;  Laterality: Right;  UPPERARM  . CERVICAL LAMINECTOMY    . EYE SURGERY Bilateral    cataract  . FOOT SURGERY Right    x2  . HERNIA REPAIR     UHR  . OPEN REDUCTION INTERNAL FIXATION (ORIF) DISTAL RADIAL FRACTURE Right 09/29/2016   Procedure: OPEN REDUCTION INTERNAL FIXATION (ORIF) RIGHT DISTAL RADIAL FRACTURE;  Surgeon: Daryll Brod, MD;  Location: [place];  Service: Orthopedics;  Laterality: Right;  . PROSTATE SURGERY    . SHOULDER ARTHROSCOPY Bilateral   . TRIGGER FINGER RELEASE  02/21/2017   Procedure: RELEASE TRIGGER FINGER/A-1 PULLEY RIGHT SMALL FINGER;  Surgeon: Daryll Brod, MD;  Location: [place];  Service: Orthopedics;;    History reviewed. No pertinent family history. Social History:  reports that he has never smoked. He has never used smokeless tobacco. He reports that he does not drink alcohol or use  drugs.  Allergies:  Allergies  Allergen Reactions  . Ibuprofen     (avoids due to ulcer history)  . Nsaids     No medications prior to admission.    No results found for this or any previous visit (from the past 48 hour(s)).  No results found.   Pertinent items are noted in HPI.  Height 6\' 1"  (1.854 m), weight 97.5 kg.  General appearance: alert, cooperative and appears stated age Head: Normocephalic, without obvious abnormality Neck: no JVD Resp: clear to auscultation bilaterally Cardio: regular rate and rhythm, S1, S2 normal, no murmur, click, rub or gallop GI: soft, non-tender; bowel sounds normal; no masses,  no organomegaly Extremities: catching left middle finger Pulses: 2+ and symmetric Skin: Skin color, texture, turgor normal. No rashes or lesions Neurologic: Grossly normal Incision/Wound: na  Assessment/Plan Diagnosed subluxation extensor tendon left middle finger.  Plan we have discussed reconstruction of the sagittal fibers to the left middle finger.  Pre-peri-postoperative course are discussed along with risks and complications.  He is aware that there is no guarantee to the surgery possibility of infection recurrence injury to arteries nerves tendons complete release symptoms dystrophy.  This scheduled as an outpatient under regional anesthesia.  Daryll Brod 02/20/2018, 10:48 AM

## 2018-02-20 NOTE — Discharge Instructions (Addendum)

## 2018-02-20 NOTE — Anesthesia Postprocedure Evaluation (Signed)
Anesthesia Post Note  Patient: Rodney Hill.  Procedure(s) Performed: RECONSTRUCTION  EXTENSOR TENDON  LEFT MIDDLE FINGER (Left Finger)     Patient location during evaluation: PACU Anesthesia Type: Bier Block Level of consciousness: awake and alert Pain management: pain level controlled Vital Signs Assessment: post-procedure vital signs reviewed and stable Respiratory status: spontaneous breathing Cardiovascular status: stable Anesthetic complications: no    Last Vitals:  Vitals:   02/20/18 1453 02/20/18 1500  BP:  136/64  Pulse: 66 65  Resp: 18 18  Temp:  36.7 C  SpO2: 98% 96%    Last Pain:  Vitals:   02/20/18 1500  TempSrc:   PainSc: 0-No pain                 Nolon Nations

## 2018-02-21 ENCOUNTER — Encounter (HOSPITAL_BASED_OUTPATIENT_CLINIC_OR_DEPARTMENT_OTHER): Payer: Self-pay | Admitting: Orthopedic Surgery

## 2018-11-14 IMAGING — CR DG ORBITS FOR FOREIGN BODY
2 series · 2 of 2 positions shown · non-contrast
Comparison: None.

CLINICAL DATA: Metal working/exposure; clearance prior to MRI

EXAM:
ORBITS FOR FOREIGN BODY - 2 VIEW

[w orbit pa (1 of 2)]
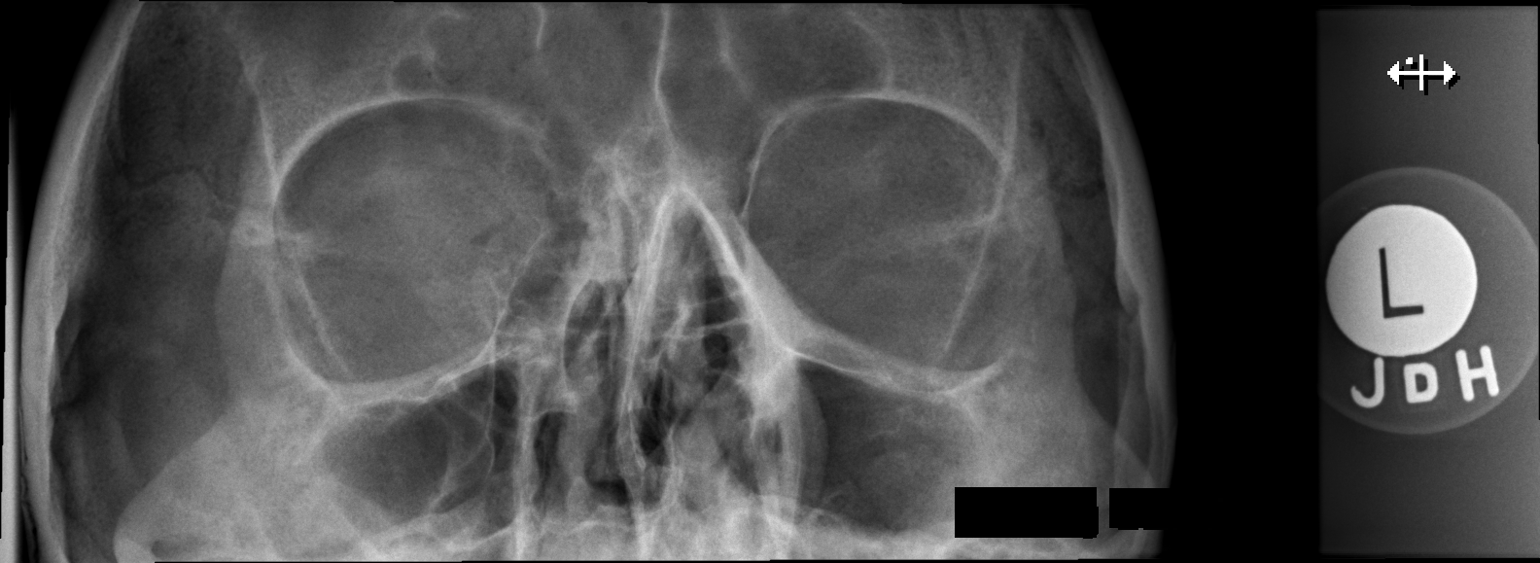

[w orbit pa (2 of 2)]
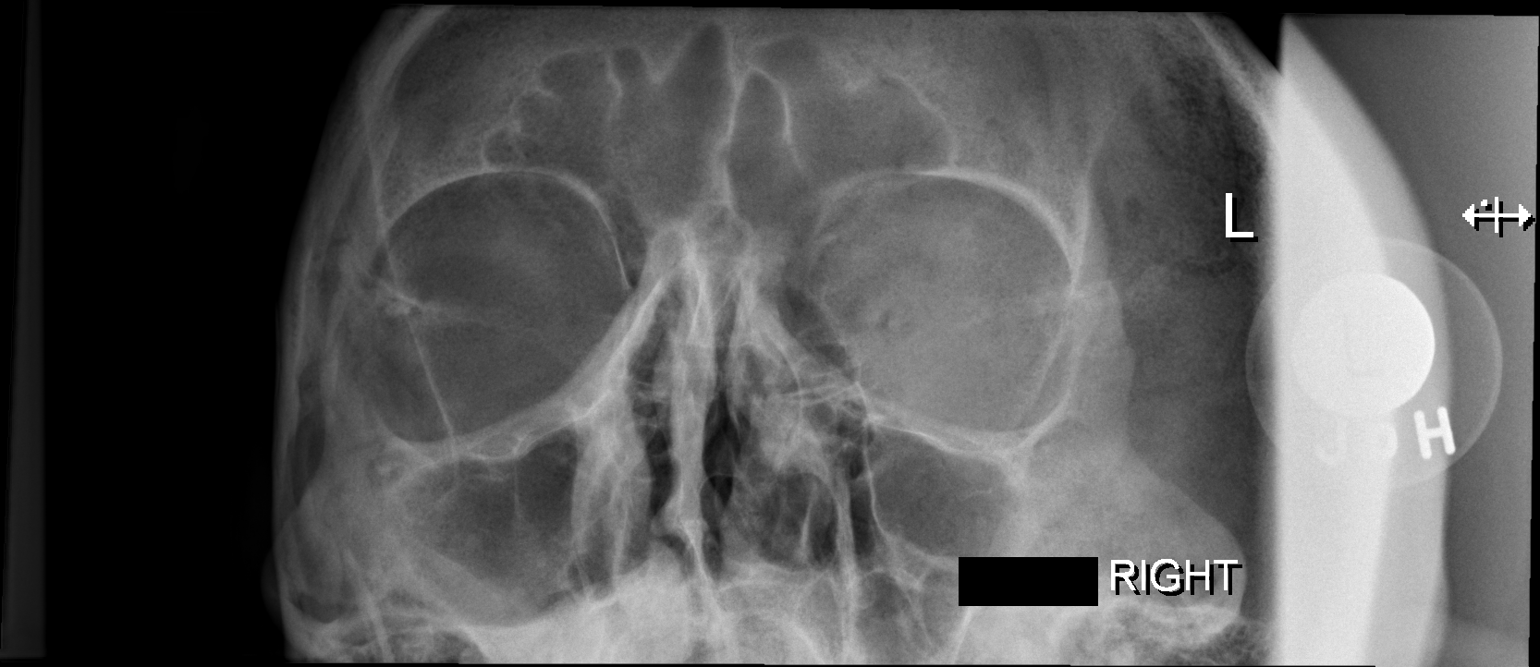

[2 of 2 positions shown; findings below may reference images not displayed]

FINDINGS: There is no evidence of metallic foreign body within the orbits. No
significant bone abnormality identified.
IMPRESSION: No evidence of metallic foreign body within the orbits.

## 2019-01-03 DIAGNOSIS — H903 Sensorineural hearing loss, bilateral: Secondary | ICD-10-CM | POA: Insufficient documentation

## 2019-01-09 ENCOUNTER — Other Ambulatory Visit: Payer: Self-pay

## 2019-01-09 ENCOUNTER — Ambulatory Visit (INDEPENDENT_AMBULATORY_CARE_PROVIDER_SITE_OTHER): Payer: Medicare Other | Admitting: Physician Assistant

## 2019-01-09 ENCOUNTER — Encounter: Payer: Self-pay | Admitting: Physician Assistant

## 2019-01-09 DIAGNOSIS — M25561 Pain in right knee: Secondary | ICD-10-CM | POA: Diagnosis not present

## 2019-01-09 MED ORDER — LIDOCAINE HCL 1 % IJ SOLN
3.0000 mL | INTRAMUSCULAR | Status: AC | PRN
  Administered 2019-01-09: 14:00:00 3 mL

## 2019-01-09 MED ORDER — METHYLPREDNISOLONE ACETATE 40 MG/ML IJ SUSP
40.0000 mg | INTRAMUSCULAR | Status: AC | PRN
  Administered 2019-01-09: 40 mg via INTRA_ARTICULAR

## 2019-01-09 NOTE — Progress Notes (Signed)
Office Visit Note   Patient: Rodney Hill.           Date of Birth: [birth year]           MRN: [id number] Visit Date: 01/09/2019              Requested by: Olena Mater, [place] [address] PCP: Olena Mater, MD   Assessment & Plan: Visit Diagnoses:  1. Acute pain of right knee     Plan:  We will see him back in 2 weeks to check his response to the cortisone injection.  If he continues to have pain in the knee may consider MRI to rule out meniscal tear.  Questions encouraged and answered at length today.  He did ask about his left foot which he has a callus over the plantar lateral aspect of the fifth metatarsal head.  No signs of infection.  Definite prominence of fifth metatarsal head.  Recommend that he keep this callus for shaved down he continues to wear the offloading pad that he has been using.  Follow-Up Instructions: Return in about 2 weeks (around 01/23/2019).   Orders:  No orders of the defined types were placed in this encounter.  No orders of the defined types were placed in this encounter.     Procedures: Large Joint Inj: R knee on 01/09/2019 2:13 PM Indications: pain Details: 22 G 1.5 in needle, anterolateral approach  Arthrogram: No  Medications: 3 mL lidocaine 1 %; 40 mg methylPREDNISolone acetate 40 MG/ML Outcome: tolerated well, no immediate complications Procedure, treatment alternatives, risks and benefits explained, specific risks discussed. Consent was given by the patient. Immediately prior to procedure a time out was called to verify the correct patient, procedure, equipment, support staff and site/side marked as required. Patient was prepped and draped in the usual sterile fashion.       Clinical Data: No additional findings.   Subjective: Chief Complaint  Patient presents with  . Right Knee - Pain    HPI Rodney Hill is well-known to Dr. Ninfa Linden service comes in today due to right knee pain for about 2  weeks.  No known injury.  He ambulates with a cane.  The denies any swelling in the knee.  Pains mostly posterior aspect of the knee.  No history of mechanical symptoms.  But he does have pain increases with twisting. He drinks with him but this radiographs are not which shows slight narrowing in the medial compartment.  Patella does right eye but this is comparable to his left patella on previous films in 2019.  Irregularity around the tibial spines when compared to AP views of the right knee in 2019 remains unchanged.  Review of Systems See HPI otherwise negative or noncontributory.  Objective: Vital Signs: There were no vitals taken for this visit.  Physical Exam General: Well-developed well-nourished male no acute distress mood affect appropriate.  Ambulates with a cane.  Slight altered gait. Ortho Exam Bilateral knees good range of motion without pain.  Positive McMurray's on the right negative on the left.  No instability valgus varus stressing of either knee.  No abnormal warmth erythema or effusion of either knee.  Tenderness along medial lateral joint lines of the right knee only.  Able to do leg raise on the right.  Nontender over the right patella tendon and patella tibial tendon. Specialty Comments:  No specialty comments available.  Imaging: No results found.   PMFS History: Patient Active Problem List  Diagnosis Date Noted  . Trochanteric bursitis, left hip 06/05/2017   Past Medical History:  Diagnosis Date  . Arthritis   . Distal radius fracture, right   . PONV (postoperative nausea and vomiting)    nauseated after colonoscopy in sept-2019  . Rupture of extensor tendon of finger    left middle finger    History reviewed. No pertinent family history.  Past Surgical History:  Procedure Laterality Date  . APPENDECTOMY    . BACK SURGERY    . CARPAL TUNNEL RELEASE Right 02/21/2017   Procedure: RIGHT CARPAL TUNNEL RELEASE;  Surgeon: Daryll Brod, MD;  Location: [place];  Service: Orthopedics;  Laterality: Right;  UPPERARM  . CERVICAL LAMINECTOMY    . EYE SURGERY Bilateral    cataract  . FOOT SURGERY Right    x2  . HERNIA REPAIR     UHR  . OPEN REDUCTION INTERNAL FIXATION (ORIF) DISTAL RADIAL FRACTURE Right 09/29/2016   Procedure: OPEN REDUCTION INTERNAL FIXATION (ORIF) RIGHT DISTAL RADIAL FRACTURE;  Surgeon: Daryll Brod, MD;  Location: [place];  Service: Orthopedics;  Laterality: Right;  . PROSTATE SURGERY    . REPAIR EXTENSOR TENDON Left 02/20/2018   Procedure: RECONSTRUCTION  EXTENSOR TENDON  LEFT MIDDLE FINGER;  Surgeon: Daryll Brod, MD;  Location: [place];  Service: Orthopedics;  Laterality: Left;  . SHOULDER ARTHROSCOPY Bilateral   . TRIGGER FINGER RELEASE  02/21/2017   Procedure: RELEASE TRIGGER FINGER/A-1 PULLEY RIGHT SMALL FINGER;  Surgeon: Daryll Brod, MD;  Location: [place];  Service: Orthopedics;;   Social History   Occupational History  . Not on file  Tobacco Use  . Smoking status: Never Smoker  . Smokeless tobacco: Never Used  Substance and Sexual Activity  . Alcohol use: No  . Drug use: No  . Sexual activity: Not on file

## 2019-01-21 ENCOUNTER — Ambulatory Visit: Payer: Medicare Other | Admitting: Physician Assistant

## 2019-01-30 ENCOUNTER — Ambulatory Visit: Payer: Medicare Other | Admitting: Physician Assistant

## 2019-02-13 ENCOUNTER — Ambulatory Visit: Payer: Medicare Other | Admitting: Physician Assistant

## 2020-02-12 ENCOUNTER — Ambulatory Visit (INDEPENDENT_AMBULATORY_CARE_PROVIDER_SITE_OTHER): Payer: Medicare Other | Admitting: Podiatry

## 2020-02-12 ENCOUNTER — Other Ambulatory Visit: Payer: Self-pay

## 2020-02-12 ENCOUNTER — Encounter: Payer: Self-pay | Admitting: Podiatry

## 2020-02-12 DIAGNOSIS — M779 Enthesopathy, unspecified: Secondary | ICD-10-CM | POA: Diagnosis not present

## 2020-02-12 DIAGNOSIS — L84 Corns and callosities: Secondary | ICD-10-CM | POA: Diagnosis not present

## 2020-02-12 MED ORDER — TRIAMCINOLONE ACETONIDE 10 MG/ML IJ SUSP
10.0000 mg | Freq: Once | INTRAMUSCULAR | Status: AC
  Administered 2020-02-12: 10 mg

## 2020-02-13 NOTE — Progress Notes (Signed)
Subjective:   Patient ID: Rodney Hill., male   DOB: 85 y.o.   MRN: [id number]   HPI Patient presents stating he has had a lot of pain underneath his left foot and he had had a previous injection a long time ago wears a pad and has tried trimming so far without relief of symptoms.  States he feels like he is walking on the bone and also had trouble with the right when not to the same degree.  Patient does not smoke likes to be active   Review of Systems  All other systems reviewed and are negative.       Objective:  Physical Exam Vitals and nursing note reviewed.  Constitutional:      Appearance: He is well-developed and well-nourished.  Cardiovascular:     Pulses: Intact distal pulses.  Pulmonary:     Effort: Pulmonary effort is normal.  Musculoskeletal:        General: Normal range of motion.  Skin:    General: Skin is warm.  Neurological:     Mental Status: He is alert.     Neurovascular status was found to be intact muscle strength is adequate range of motion adequate.  Patient does have moderate cavus foot structure digital deformities and has prominence around the fifth metatarsal head left with mild plantar fluid accumulation and keratotic tissue and keratotic tissue also extending to the right foot.  Good digital perfusion well oriented x3      Assessment:  Excessive pressure on the fifth metatarsal head left with fluid buildup and lesion formation     Plan:  H&P reviewed condition.  Today I went ahead did sterile prep and injected the plantar capsule 2 mg Kenalog 5 mg Xylocaine debrided the lesion fully applied padding and discussed the possibility for fifth metatarsal head resection which may be necessary in this case.  Patient will be seen back in 6 weeks and if symptoms are persistent surgical intervention will most likely be necessary and I reviewed this with him and caregiver

## 2020-03-18 ENCOUNTER — Encounter: Payer: Self-pay | Admitting: Podiatry

## 2020-03-18 ENCOUNTER — Other Ambulatory Visit: Payer: Self-pay

## 2020-03-18 ENCOUNTER — Ambulatory Visit (INDEPENDENT_AMBULATORY_CARE_PROVIDER_SITE_OTHER): Payer: Medicare Other | Admitting: Podiatry

## 2020-03-18 DIAGNOSIS — L600 Ingrowing nail: Secondary | ICD-10-CM | POA: Diagnosis not present

## 2020-03-18 DIAGNOSIS — M21622 Bunionette of left foot: Secondary | ICD-10-CM | POA: Diagnosis not present

## 2020-03-18 NOTE — Patient Instructions (Signed)

## 2020-03-18 NOTE — Progress Notes (Signed)
Subjective:   Patient ID: Rodney Hill., male   DOB: 86 y.o.   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  For the tailor's bunion continue with cushion shoes white mesh materials and for the ingrown I recommended correction and today I explained procedure risk and patient signed consent form.  Infiltrated the left hallux 60 mg like Marcaine mixture sterile prep done and using sterile instrumentation remove the lateral border exposed matrix and applied phenol 3 applications 30 seconds followed by alcohol lavage and sterile dressing.  Gave instructions on soaks and leave dressing on 24 hours but take it off earlier if needed and encourage patient to call with questions concerns

## 2020-07-20 ENCOUNTER — Encounter: Payer: Self-pay | Admitting: Podiatry

## 2020-07-20 ENCOUNTER — Other Ambulatory Visit: Payer: Self-pay

## 2020-07-20 ENCOUNTER — Ambulatory Visit (INDEPENDENT_AMBULATORY_CARE_PROVIDER_SITE_OTHER): Payer: Medicare Other | Admitting: Podiatry

## 2020-07-20 DIAGNOSIS — M778 Other enthesopathies, not elsewhere classified: Secondary | ICD-10-CM | POA: Diagnosis not present

## 2020-07-20 MED ORDER — TRIAMCINOLONE ACETONIDE 10 MG/ML IJ SUSP
20.0000 mg | Freq: Once | INTRAMUSCULAR | Status: AC
  Administered 2020-07-20: 20 mg

## 2020-07-20 NOTE — Progress Notes (Signed)
Subjective:   Patient ID: Rodney Hill., male   DOB: 86 y.o.   MRN: [id number]   HPI Patient presents with pain around the fifth metatarsal head of both feet with inflammation fluid buildup stating they have both been tender and his caregiver with him today   ROS      Objective:  Physical Exam  Neurovascular status intact with inflamed fifth MPJ bilateral painful     Assessment:  Inflammatory capsulitis fifth MPJ bilateral     Plan:  Sterile prep injected the fifth MPJ bilateral 3 mg Dexasone Kenalog 5 mg Xylocaine advised on reduced activity and soaks and reappoint as needed

## 2021-09-03 ENCOUNTER — Encounter: Payer: Self-pay | Admitting: Podiatry

## 2021-09-03 ENCOUNTER — Ambulatory Visit (INDEPENDENT_AMBULATORY_CARE_PROVIDER_SITE_OTHER): Payer: Medicare Other

## 2021-09-03 ENCOUNTER — Ambulatory Visit (INDEPENDENT_AMBULATORY_CARE_PROVIDER_SITE_OTHER): Payer: Medicare Other | Admitting: Podiatry

## 2021-09-03 DIAGNOSIS — M2041 Other hammer toe(s) (acquired), right foot: Secondary | ICD-10-CM | POA: Diagnosis not present

## 2021-09-03 DIAGNOSIS — R52 Pain, unspecified: Secondary | ICD-10-CM | POA: Diagnosis not present

## 2021-09-03 DIAGNOSIS — M7752 Other enthesopathy of left foot: Secondary | ICD-10-CM | POA: Diagnosis not present

## 2021-09-03 MED ORDER — TRIAMCINOLONE ACETONIDE 10 MG/ML IJ SUSP
10.0000 mg | Freq: Once | INTRAMUSCULAR | Status: AC
  Administered 2021-09-03: 10 mg

## 2021-09-05 NOTE — Progress Notes (Signed)
Subjective:   Patient ID: Rodney Hill., male   DOB: 87 y.o.   MRN: [id number]   HPI Patient presents today with pain in the right second digit and in the forefoot left with inflammation fluid around the first MPJ.  States both of them have been bothering   ROS      Objective:  Physical Exam  Neurovascular status intact with inflammation of the left first MPJ with fluid around the joint surface and discomfort of the second digit right with inflammation around the digit itself with moderate elevation of the toe     Assessment:  Inflammatory capsulitis first MPJ left along with digital deformity second right     Plan:  NP reviewed both conditions and for the left I did do sterile prep and did periarticular injection of the first MPJ 3 mg Kenalog 5 mg Xylocaine and for the right I discussed the digital deformity and reviewed possibility for digital fusion educating him and caregiver on what would be required with cushioning applied today to try to take pressure off the joint  X-rays indicate there is severe rigid contracture digit to right over left with moderate deformity left

## 2021-09-08 ENCOUNTER — Other Ambulatory Visit: Payer: Self-pay | Admitting: Podiatry

## 2021-09-08 DIAGNOSIS — M2041 Other hammer toe(s) (acquired), right foot: Secondary | ICD-10-CM

## 2022-09-26 ENCOUNTER — Encounter: Payer: Self-pay | Admitting: Physician Assistant

## 2022-09-26 ENCOUNTER — Other Ambulatory Visit (INDEPENDENT_AMBULATORY_CARE_PROVIDER_SITE_OTHER): Payer: Medicare Other

## 2022-09-26 ENCOUNTER — Ambulatory Visit (INDEPENDENT_AMBULATORY_CARE_PROVIDER_SITE_OTHER): Payer: Medicare Other | Admitting: Physician Assistant

## 2022-09-26 VITALS — Ht 73.0 in | Wt 208.0 lb

## 2022-09-26 DIAGNOSIS — M25512 Pain in left shoulder: Secondary | ICD-10-CM

## 2022-09-26 MED ORDER — LIDOCAINE HCL 1 % IJ SOLN
3.0000 mL | INTRAMUSCULAR | Status: AC | PRN
Start: 2022-09-26 — End: 2022-09-26
  Administered 2022-09-26: 3 mL

## 2022-09-26 MED ORDER — METHYLPREDNISOLONE ACETATE 40 MG/ML IJ SUSP
40.0000 mg | INTRAMUSCULAR | Status: AC | PRN
Start: 2022-09-26 — End: 2022-09-26
  Administered 2022-09-26: 40 mg via INTRA_ARTICULAR

## 2022-09-26 NOTE — Progress Notes (Signed)
   Procedure Note  Patient: Rodney Hill.             Date of Birth: [birth year]           MRN: [id number]             Visit Date: 09/26/2022 Mr. Ahrendt comes in today with left shoulder pain for the last 2 to 3 months.  He does state that he fell up against 11.  The skin about the shoulder but has had no other injury.  He is right-hand dominant.  Denies any radicular symptoms down the arm.  History of a rotator cuff repair on the left side years ago.  Physical exam: Height 6 foot 1 weight 208 pounds General well-developed well-nourished male in no acute distress mood and affect appropriate. Bilateral shoulders he is able to get both shoulders above his head but ratchet step up therapy.  Also with abduction able to fully bring arms overhead but again ratchet's the arms.  5 out of 5 strength external and internal rotation against resistance bilaterally.  Empty can test negative bilaterally.  Liftoff test is negative bilaterally.  Impingement testing positive on the left.  Radiographs: Left shoulder 3 views: Shoulder is well located.  Minimal arthritic changes the inferior aspect of the glenoid.  Prior rotator cuff repair with anchors present in the humeral head.  No acute findings  Impression: Left shoulder impingement  Procedures: Visit Diagnoses:  1. Acute pain of left shoulder     Large Joint Inj: L subacromial bursa on 09/26/2022 4:57 PM Indications: pain Details: 22 G 1.5 in needle, superior approach  Arthrogram: No  Medications: 3 mL lidocaine 1 %; 40 mg methylPREDNISolone acetate 40 MG/ML Outcome: tolerated well, no immediate complications Procedure, treatment alternatives, risks and benefits explained, specific risks discussed. Consent was given by the patient. Immediately prior to procedure a time out was called to verify the correct patient, procedure, equipment, support staff and site/side marked as required. Patient was prepped and draped in the usual sterile fashion.      Plan: He is given a prescription for formal physical therapy to work on range of motion strengthening bilateral shoulders.  He will include modalities home exercise program.  Will see him back in 2 weeks to see how he is doing overall.  Questions were encouraged and answered at length.
